# Patient Record
Sex: Female | Born: 1970 | Race: Black or African American | Hispanic: No | Smoking: Never smoker
Health system: Southern US, Community
[De-identification: ages and names within clinical notes are randomized; demographics above are authoritative.]

## PROBLEM LIST (undated history)

## (undated) DIAGNOSIS — H409 Unspecified glaucoma: Secondary | ICD-10-CM

## (undated) DIAGNOSIS — I10 Essential (primary) hypertension: Secondary | ICD-10-CM

## (undated) HISTORY — PX: OTHER SURGICAL HISTORY: SHX169

## (undated) HISTORY — PX: ABDOMINAL HYSTERECTOMY: SHX81

---

## 2004-12-23 ENCOUNTER — Emergency Department: Payer: Self-pay | Admitting: Emergency Medicine

## 2006-07-29 ENCOUNTER — Emergency Department: Payer: Self-pay | Admitting: Emergency Medicine

## 2006-08-24 ENCOUNTER — Emergency Department: Payer: Self-pay | Admitting: Emergency Medicine

## 2006-08-24 ENCOUNTER — Other Ambulatory Visit: Payer: Self-pay

## 2008-12-18 ENCOUNTER — Emergency Department: Payer: Self-pay | Admitting: Unknown Physician Specialty

## 2009-03-03 ENCOUNTER — Emergency Department: Payer: Self-pay | Admitting: Emergency Medicine

## 2009-05-04 ENCOUNTER — Ambulatory Visit: Payer: Self-pay | Admitting: Internal Medicine

## 2009-08-26 ENCOUNTER — Ambulatory Visit: Payer: Self-pay | Admitting: Obstetrics and Gynecology

## 2009-09-01 ENCOUNTER — Ambulatory Visit: Payer: Self-pay | Admitting: Obstetrics and Gynecology

## 2009-09-14 ENCOUNTER — Ambulatory Visit: Payer: Self-pay | Admitting: Internal Medicine

## 2009-09-17 ENCOUNTER — Ambulatory Visit: Payer: Self-pay | Admitting: Otolaryngology

## 2009-10-08 ENCOUNTER — Inpatient Hospital Stay: Payer: Self-pay | Admitting: Specialist

## 2010-05-27 ENCOUNTER — Ambulatory Visit: Payer: Self-pay | Admitting: Internal Medicine

## 2010-06-29 ENCOUNTER — Ambulatory Visit: Payer: Self-pay | Admitting: Internal Medicine

## 2011-07-26 ENCOUNTER — Ambulatory Visit: Payer: Self-pay | Admitting: Internal Medicine

## 2011-08-16 ENCOUNTER — Ambulatory Visit: Payer: Self-pay | Admitting: Specialist

## 2011-08-24 ENCOUNTER — Ambulatory Visit: Payer: Self-pay | Admitting: Specialist

## 2011-08-24 LAB — HEMOGLOBIN: HGB: 11.6 g/dL — ABNORMAL LOW (ref 12.0–16.0)

## 2011-10-11 ENCOUNTER — Ambulatory Visit: Payer: Self-pay | Admitting: Emergency Medicine

## 2011-12-11 IMAGING — CR RIGHT ANKLE - COMPLETE 3+ VIEW
1 series · 5 of 5 positions shown · non-contrast
Comparison: none

REASON FOR EXAM: pain and swelling
COMMENTS:

PROCEDURE:     DXR - DXR ANKLE RIGHT COMPLETE  - October 08, 2009  [DATE]
RESULT:     Comparison: None

[Series 1: view not recorded · 0.17mm/px · 5 of 5 slices shown]
[im 1/5]
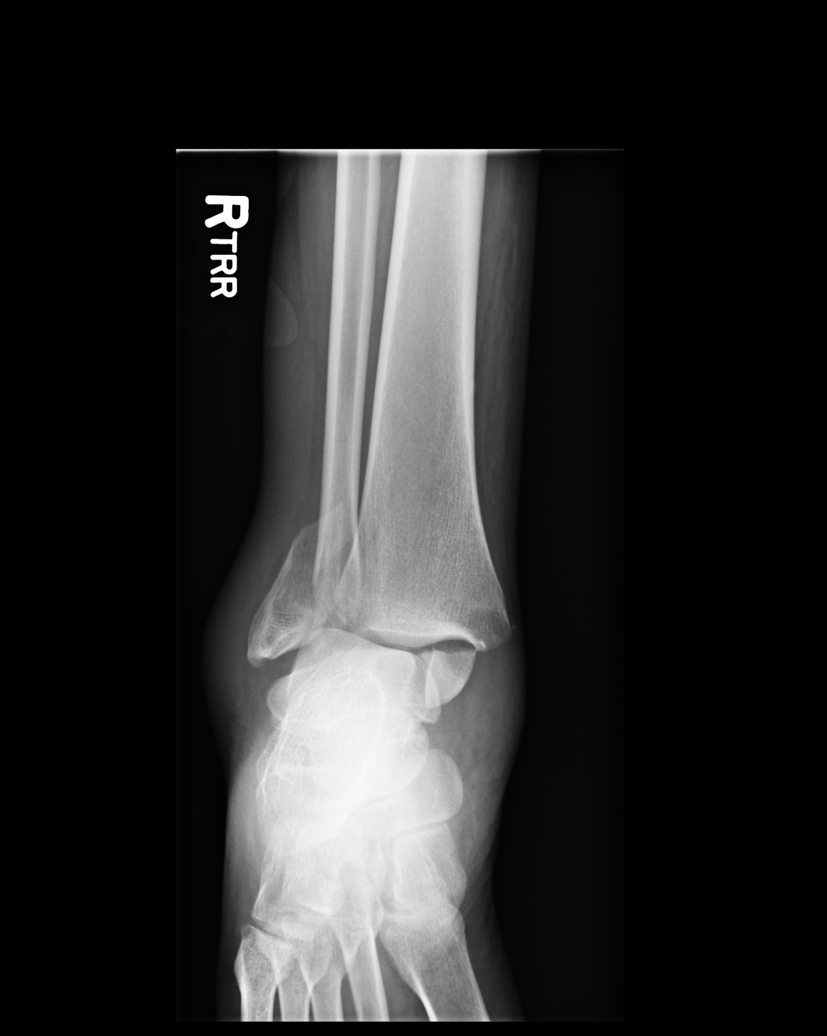
[im 2/5]
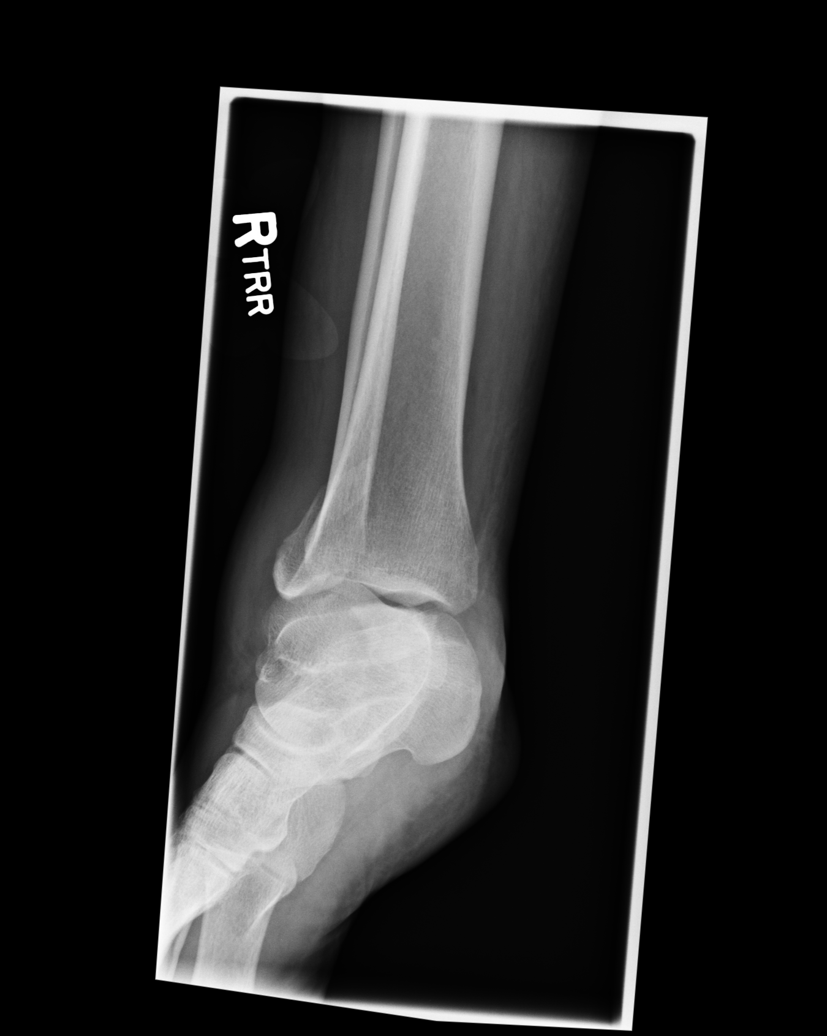
[im 3/5]
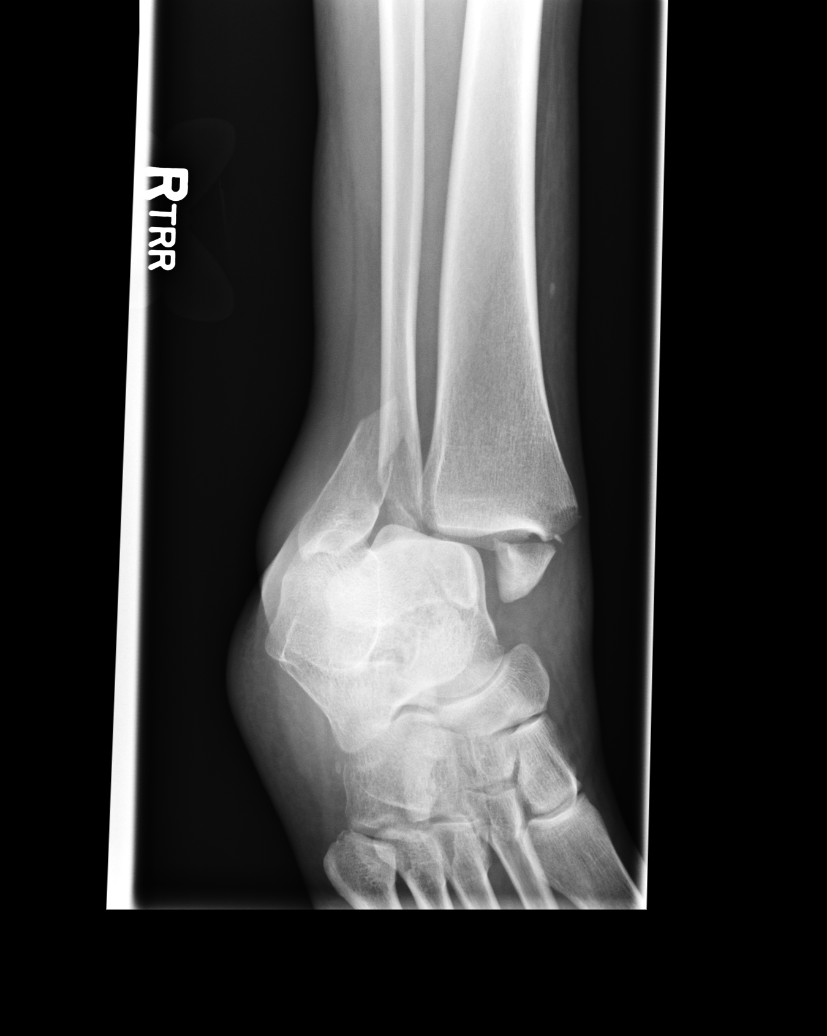
[im 4/5]
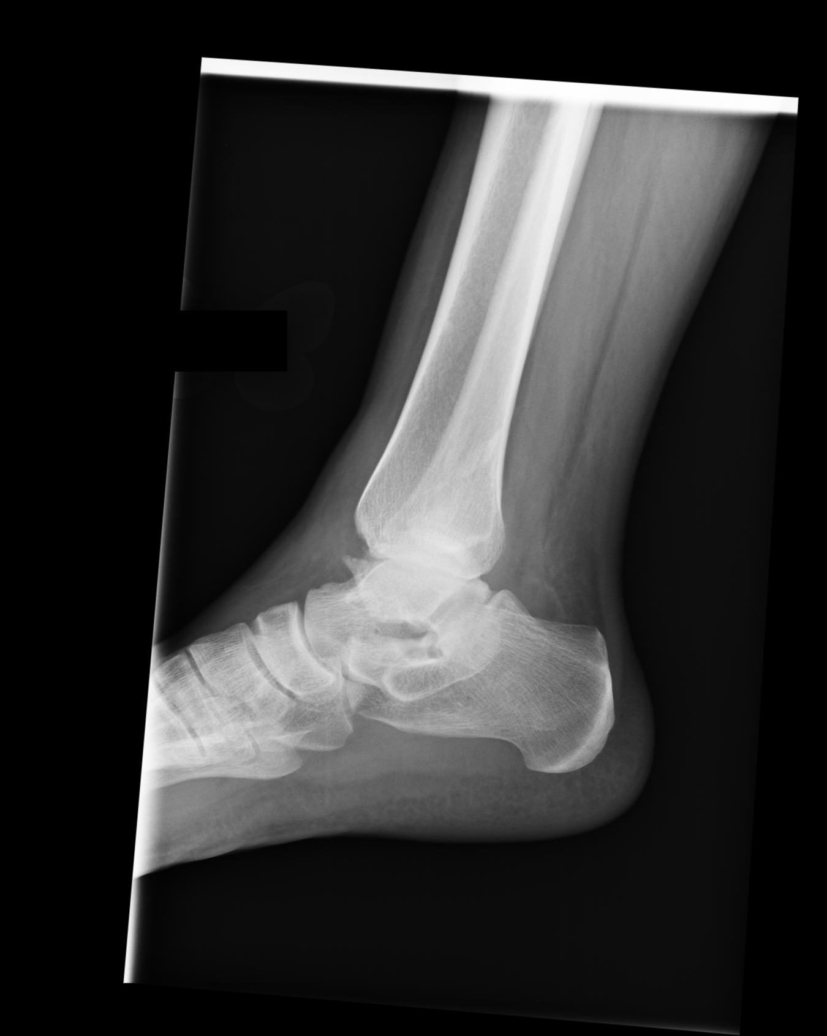
[im 5/5]
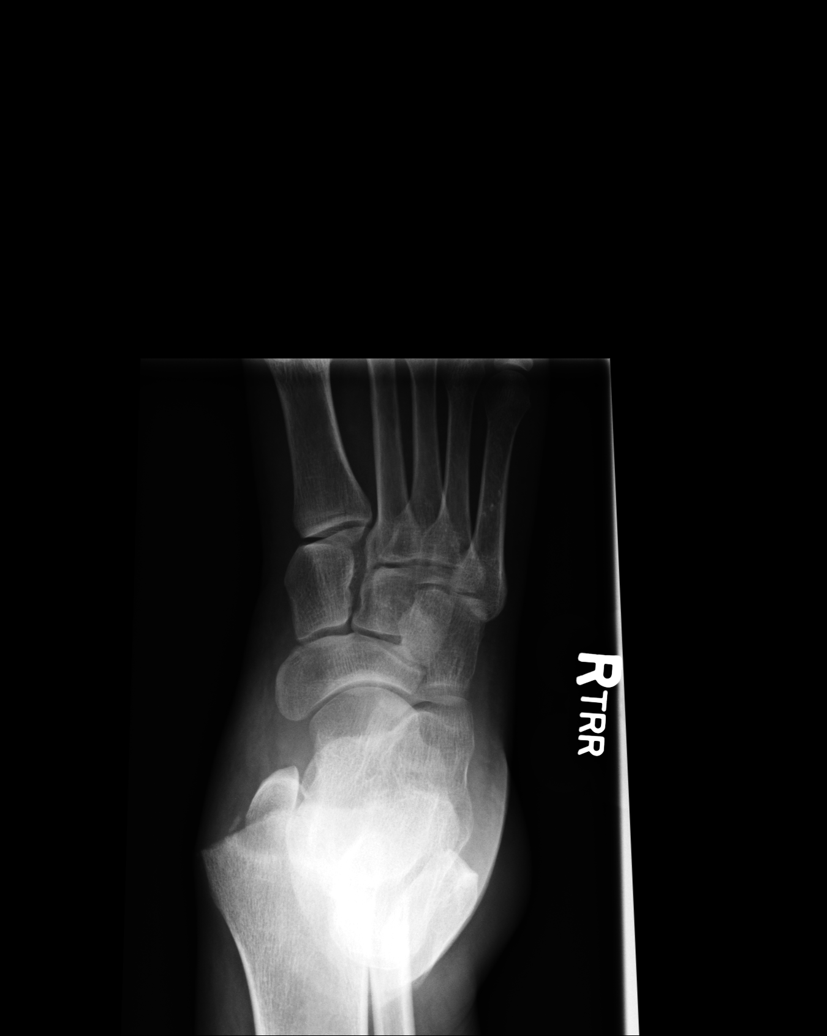

[5 of 5 positions shown; findings below may reference images not displayed]

FINDINGS: 5 views of the right ankle demonstrates an oblique displaced fracture of the
distal fibular diametaphysis. There is a laterally displaced fracture of the
medial malleolus. There is lateral subluxation of the talar dome relative to
the tibial plafond. There is severe soft tissue swelling surrounding the
ankle.
IMPRESSION: Please see above.

## 2012-07-29 IMAGING — US US EXTREM LOW VENOUS*R*
1 series · 17 of 24 positions shown · non-contrast
Comparison: none

REASON FOR EXAM: CR  [id number]  rt ankle pain swelling  hx ankle
fracture eval for DVT
COMMENTS:

[Series 1: us extrem low venous*right* · 17 of 25 slices shown]
[im 1/25]
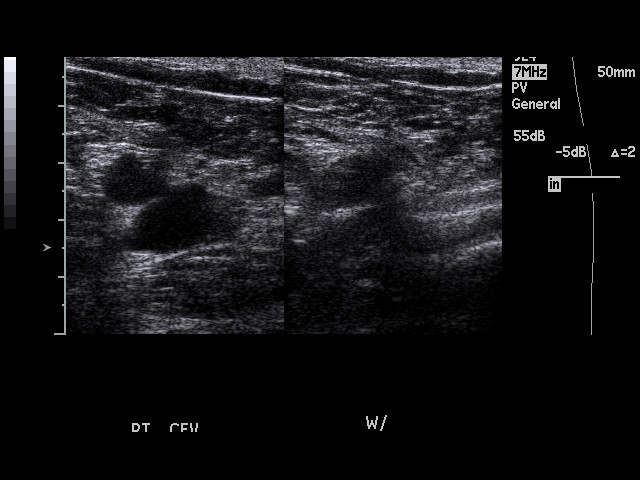
[im 3/25]
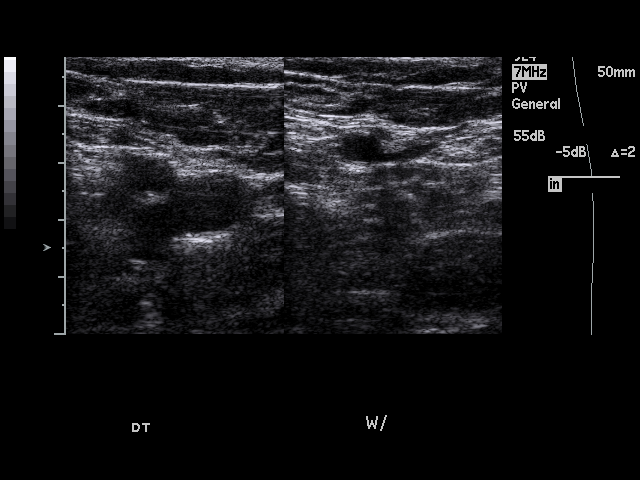
[im 4/25]
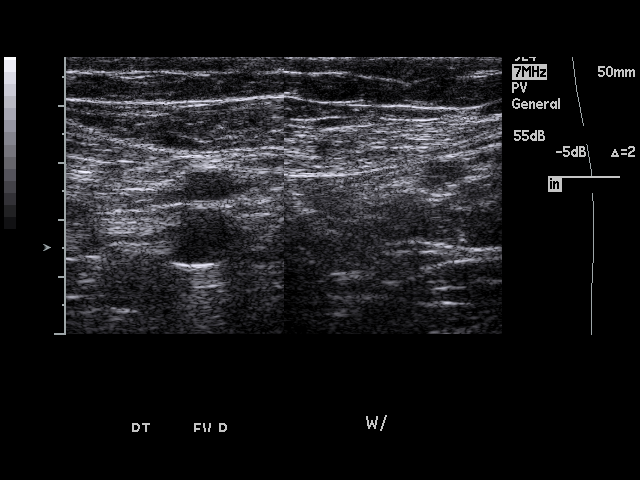
[im 5/25]
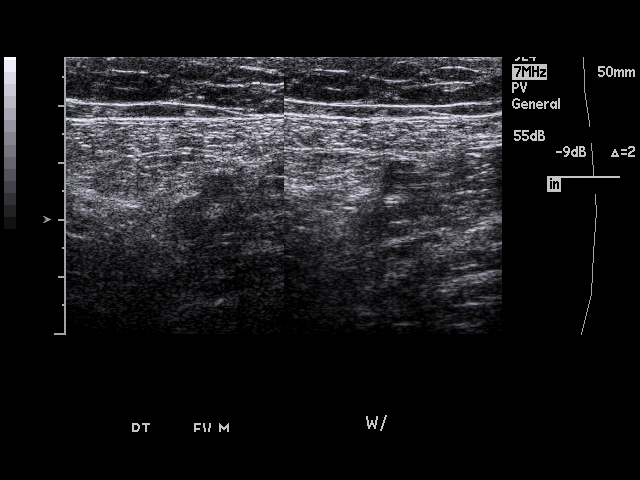
[im 7/25]
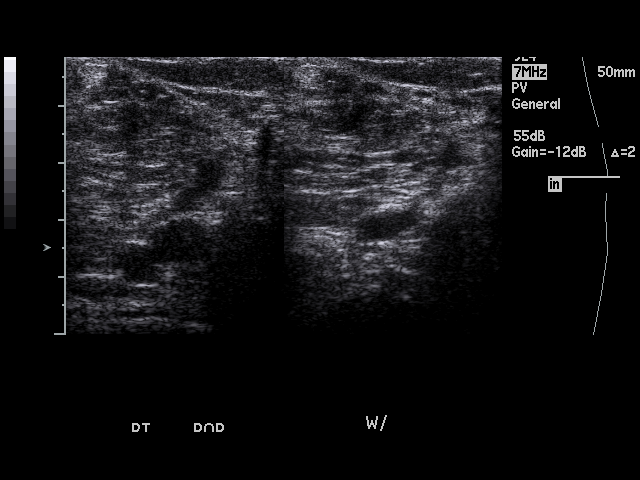
[im 8/25]
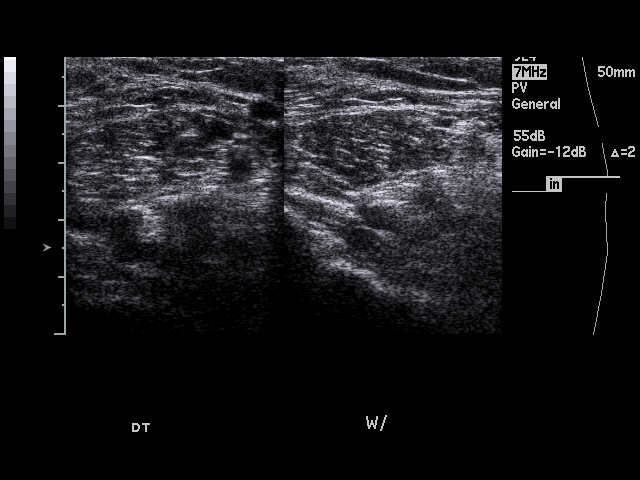
[im 10/25]
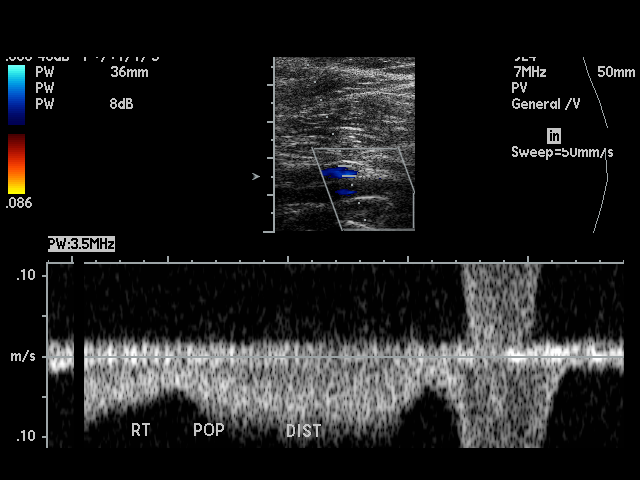
[im 11/25]
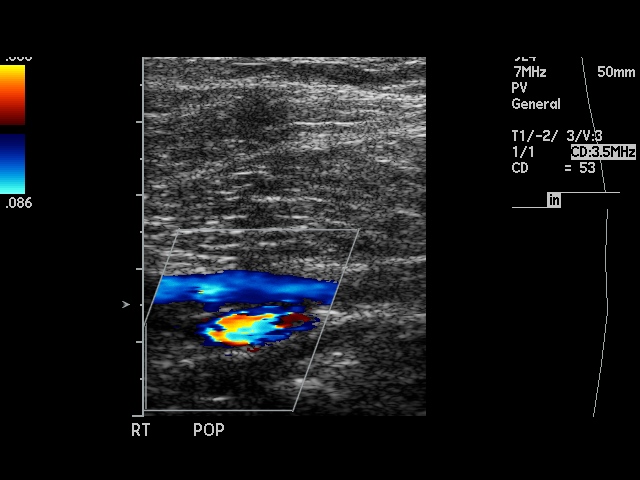
[im 13/25]
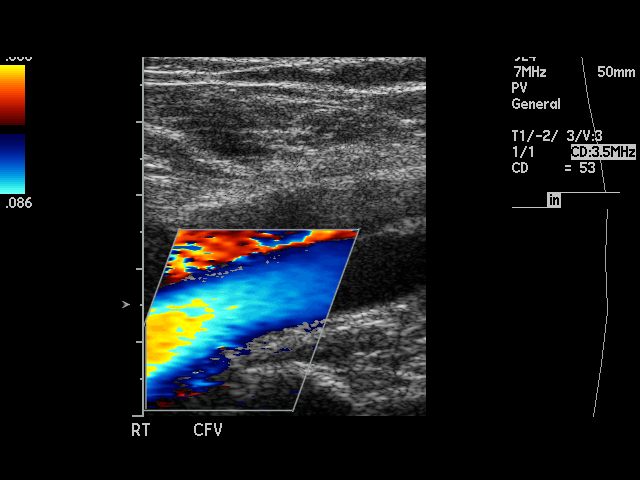
[im 14/25]
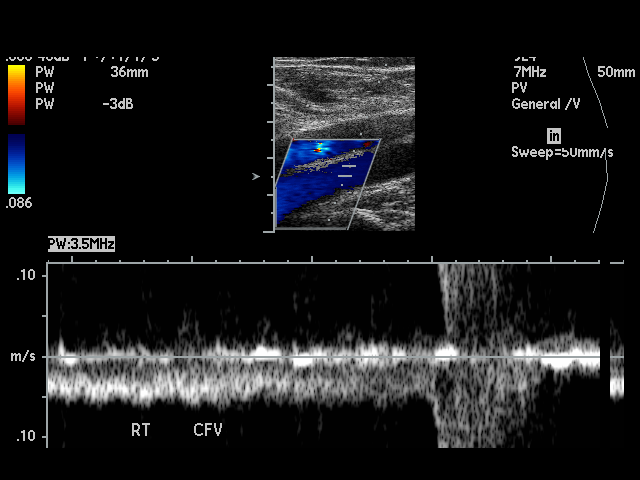
[im 15/25]
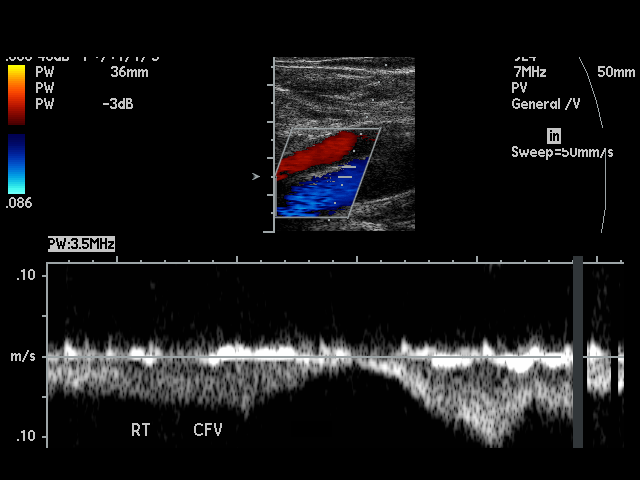
[im 17/25]
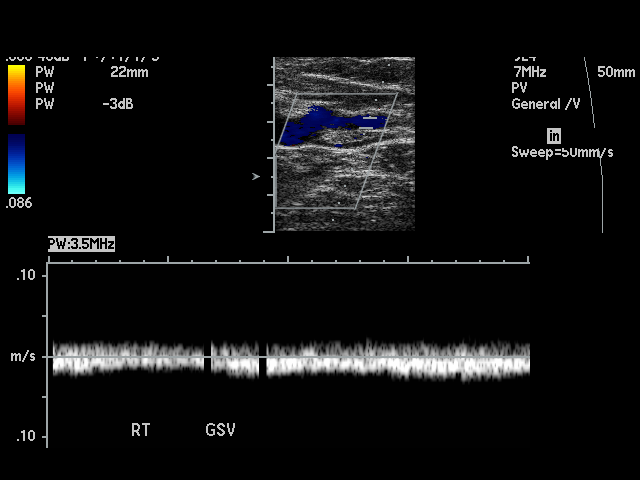
[im 18/25]
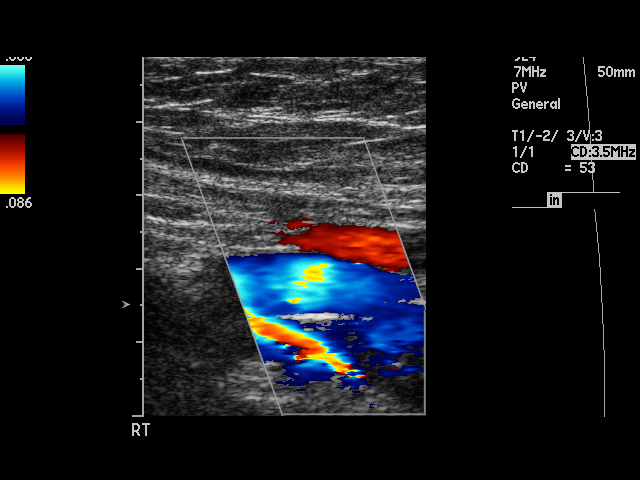
[im 20/25]
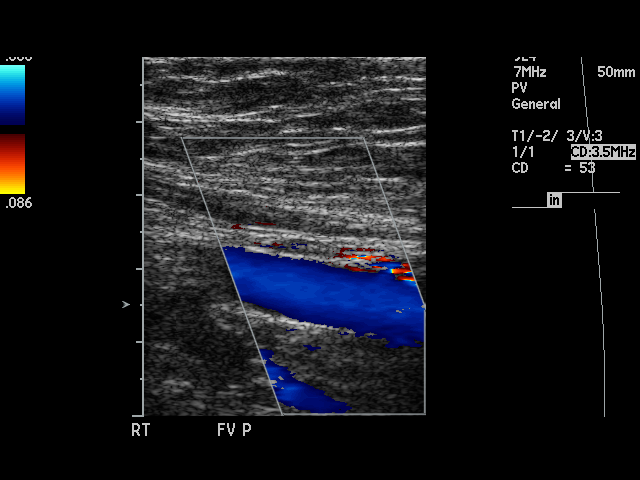
[im 21/25]
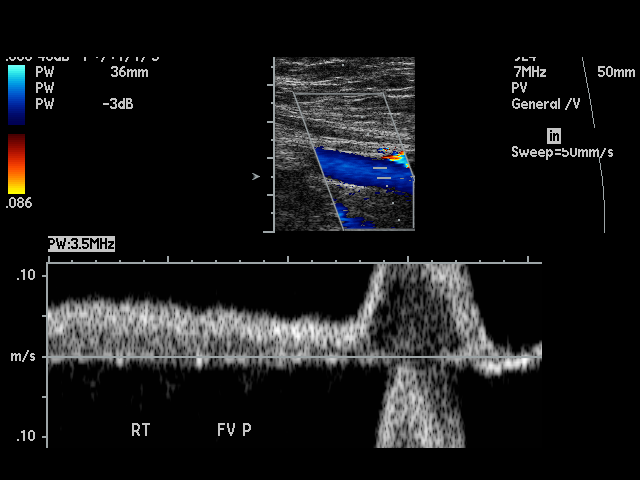
[im 22/25]
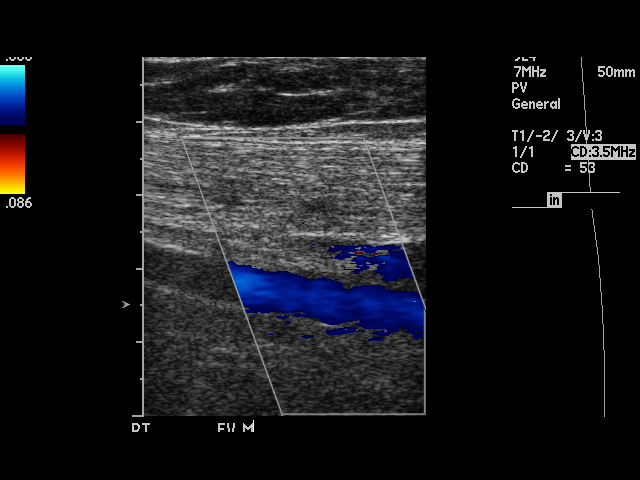
[im 25/25]
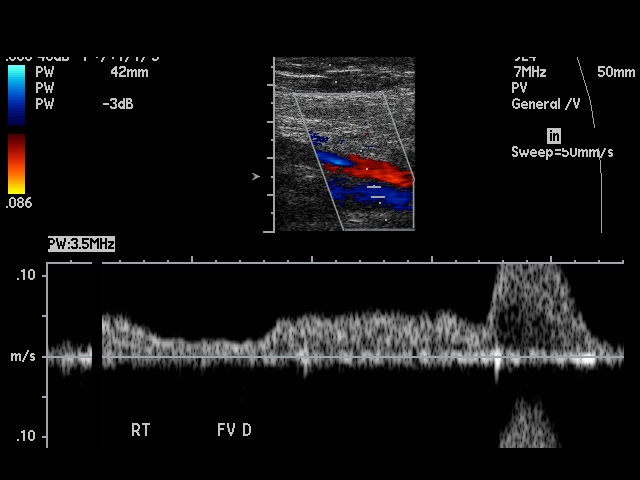

[17 of 24 positions shown; findings below may reference images not displayed]

PROCEDURE:     CAMBEROS - CAMBEROS DOPPLER VEINS RT LEG EXTR  - May 27, 2010  [DATE]

RESULT:     The phasic, augmentation and Valsalva waveforms are normal in
appearance. The right femoral and popliteal vein shows complete
compressibility throughout its course. Doppler examination shows no
occlusion or evidence for deep vein thrombosis.
IMPRESSION: No deep vein thrombosis is identified in the right leg.

## 2012-08-31 ENCOUNTER — Ambulatory Visit: Payer: Self-pay | Admitting: Internal Medicine

## 2013-08-16 ENCOUNTER — Emergency Department: Payer: Self-pay | Admitting: Emergency Medicine

## 2013-09-02 ENCOUNTER — Ambulatory Visit: Payer: Self-pay | Admitting: Internal Medicine

## 2013-11-25 ENCOUNTER — Emergency Department: Payer: Self-pay | Admitting: Emergency Medicine

## 2014-03-10 ENCOUNTER — Ambulatory Visit: Payer: Self-pay | Admitting: General Practice

## 2014-03-26 ENCOUNTER — Ambulatory Visit: Payer: Self-pay | Admitting: Anesthesiology

## 2014-03-26 DIAGNOSIS — I10 Essential (primary) hypertension: Secondary | ICD-10-CM

## 2014-03-26 DIAGNOSIS — Z01810 Encounter for preprocedural cardiovascular examination: Secondary | ICD-10-CM

## 2014-03-31 ENCOUNTER — Ambulatory Visit: Payer: Self-pay | Admitting: General Practice

## 2014-09-22 ENCOUNTER — Ambulatory Visit
Admit: 2014-09-22 | Disposition: A | Discharge status: Home / Self Care | Payer: Self-pay | Attending: Internal Medicine | Admitting: Internal Medicine
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
## 2014-10-05 NOTE — Op Note (Signed)
PATIENT NAME:  Mallory Holland, Mallory Holland KidneyDEBRA MR#:  [id number] DATE OF BIRTH:  May 09, 1971  DATE OF PROCEDURE:  08/24/2011  PREOPERATIVE DIAGNOSIS: Painful hardware with healed fractures of right ankle.   POSTOPERATIVE DIAGNOSIS: Painful hardware with healed fractures of right ankle.  OPERATION: Deep hardware removal, right ankle (10 screws and one locking plate).   SURGEON: Valinda HoarHoward E. Eaven Schwager, MD   ANESTHESIA: General LMA.   COMPLICATIONS: None.   DRAINS: None.   ESTIMATED BLOOD LOSS: None.   DESCRIPTION OF PROCEDURE: The patient was brought to the Operating Room where she underwent satisfactory general LMA anesthesia in the supine position. The right leg was prepped and draped in sterile fashion and an Esmarch was applied. The tourniquet was inflated to 350 mmHg. Tourniquet time was 32 minutes. The wounds were well healed, and there was no soft tissue edema. The lateral incision was reopened sharply and dissection carried out sharply through scar tissue and bursa down to the plate. The plate was exposed and the screws removed along with the plate without difficulty. Bursal tissue over the plate was excised sharply. The rongeur was used to smooth the bone down. The wound was irrigated and closed with 4-0 Vicryl and staples. A short longitudinal incision was made over the medial malleolus using only a portion of the previous incision. Dissection was carried out sharply down to scar tissue, and both screws were identified. These were removed without difficulty. A rongeur was used to remove some of the scar tissue, and the wound was then irrigated. It was closed with 4-0 Vicryl and staples; 0.5% Marcaine was placed in both wounds. A dry sterile compression dressing with posterior splint was applied.     The tourniquet was deflated with good return of blood flow to the foot. The patient was awakened and taken to recovery in good condition.  ____________________________ Valinda HoarHoward E. Lakenzie Mcclafferty, MD hem:cbb D: 08/24/2011  08:41:37 ET T: 08/24/2011 12:52:21 ET JOB#: [id number]  cc: Valinda HoarHoward E. Katheryne Gorr, MD, <Dictator> Valinda HoarHOWARD E Ellery Meroney MD ELECTRONICALLY SIGNED 08/27/2011 0:20

## 2015-10-26 ENCOUNTER — Other Ambulatory Visit: Payer: Self-pay | Admitting: Internal Medicine

## 2015-10-26 DIAGNOSIS — Z1231 Encounter for screening mammogram for malignant neoplasm of breast: Secondary | ICD-10-CM

## 2015-11-10 ENCOUNTER — Ambulatory Visit
Admission: RE | Admit: 2015-11-10 | Discharge: 2015-11-10 | Disposition: A | Discharge status: Home / Self Care | Payer: Medicaid Other | Source: Ambulatory Visit | Attending: Internal Medicine | Admitting: Internal Medicine

## 2015-11-10 DIAGNOSIS — Z1231 Encounter for screening mammogram for malignant neoplasm of breast: Secondary | ICD-10-CM

## 2016-09-29 ENCOUNTER — Other Ambulatory Visit: Payer: Self-pay | Admitting: Internal Medicine

## 2016-10-11 ENCOUNTER — Other Ambulatory Visit: Payer: Self-pay | Admitting: Internal Medicine

## 2016-10-11 DIAGNOSIS — Z1231 Encounter for screening mammogram for malignant neoplasm of breast: Secondary | ICD-10-CM

## 2016-11-10 ENCOUNTER — Ambulatory Visit
Admission: RE | Admit: 2016-11-10 | Discharge: 2016-11-10 | Disposition: A | Discharge status: Home / Self Care | Payer: Medicare Other | Source: Ambulatory Visit | Attending: Internal Medicine | Admitting: Internal Medicine

## 2016-11-10 DIAGNOSIS — Z1231 Encounter for screening mammogram for malignant neoplasm of breast: Secondary | ICD-10-CM | POA: Insufficient documentation

## 2016-11-17 ENCOUNTER — Ambulatory Visit
Admission: RE | Admit: 2016-11-17 | Discharge: 2016-11-17 | Disposition: A | Discharge status: Home / Self Care | Payer: Medicare Other | Source: Ambulatory Visit | Attending: Internal Medicine | Admitting: Internal Medicine

## 2016-11-17 ENCOUNTER — Other Ambulatory Visit: Payer: Self-pay | Admitting: Internal Medicine

## 2016-11-17 DIAGNOSIS — S93402S Sprain of unspecified ligament of left ankle, sequela: Secondary | ICD-10-CM | POA: Insufficient documentation

## 2016-11-17 DIAGNOSIS — X58XXXS Exposure to other specified factors, sequela: Secondary | ICD-10-CM | POA: Insufficient documentation

## 2016-11-17 DIAGNOSIS — T148XXA Other injury of unspecified body region, initial encounter: Secondary | ICD-10-CM

## 2016-11-22 ENCOUNTER — Other Ambulatory Visit: Payer: Self-pay | Admitting: Internal Medicine

## 2016-11-22 DIAGNOSIS — M79661 Pain in right lower leg: Secondary | ICD-10-CM

## 2016-11-24 ENCOUNTER — Ambulatory Visit: Payer: Medicaid Other

## 2016-11-29 ENCOUNTER — Ambulatory Visit
Admission: RE | Admit: 2016-11-29 | Discharge: 2016-11-29 | Disposition: A | Discharge status: Home / Self Care | Payer: Medicare Other | Source: Ambulatory Visit | Attending: Internal Medicine | Admitting: Internal Medicine

## 2016-11-29 DIAGNOSIS — M79661 Pain in right lower leg: Secondary | ICD-10-CM | POA: Insufficient documentation

## 2017-03-19 ENCOUNTER — Emergency Department (HOSPITAL_COMMUNITY): Payer: Medicare Other

## 2017-03-19 ENCOUNTER — Encounter (HOSPITAL_COMMUNITY): Payer: Self-pay | Admitting: *Deleted

## 2017-03-19 ENCOUNTER — Emergency Department (HOSPITAL_COMMUNITY)
Admission: EM | Admit: 2017-03-19 | Discharge: 2017-03-19 | Disposition: A | Discharge status: Home Health | Payer: Medicare Other | Attending: Emergency Medicine | Admitting: Emergency Medicine

## 2017-03-19 DIAGNOSIS — R52 Pain, unspecified: Secondary | ICD-10-CM

## 2017-03-19 DIAGNOSIS — Y999 Unspecified external cause status: Secondary | ICD-10-CM | POA: Diagnosis not present

## 2017-03-19 DIAGNOSIS — M546 Pain in thoracic spine: Secondary | ICD-10-CM | POA: Diagnosis not present

## 2017-03-19 DIAGNOSIS — S20212A Contusion of left front wall of thorax, initial encounter: Secondary | ICD-10-CM | POA: Insufficient documentation

## 2017-03-19 DIAGNOSIS — S0083XA Contusion of other part of head, initial encounter: Secondary | ICD-10-CM | POA: Diagnosis not present

## 2017-03-19 DIAGNOSIS — Y939 Activity, unspecified: Secondary | ICD-10-CM | POA: Diagnosis not present

## 2017-03-19 DIAGNOSIS — S00511A Abrasion of lip, initial encounter: Secondary | ICD-10-CM | POA: Diagnosis not present

## 2017-03-19 DIAGNOSIS — Y92009 Unspecified place in unspecified non-institutional (private) residence as the place of occurrence of the external cause: Secondary | ICD-10-CM | POA: Insufficient documentation

## 2017-03-19 DIAGNOSIS — S0003XA Contusion of scalp, initial encounter: Secondary | ICD-10-CM

## 2017-03-19 DIAGNOSIS — S63501A Unspecified sprain of right wrist, initial encounter: Secondary | ICD-10-CM | POA: Insufficient documentation

## 2017-03-19 DIAGNOSIS — S0080XA Unspecified superficial injury of other part of head, initial encounter: Secondary | ICD-10-CM | POA: Diagnosis present

## 2017-03-19 MED ORDER — CYCLOBENZAPRINE HCL 5 MG PO TABS: 5.0000 mg | ORAL_TABLET | Freq: Three times a day (TID) | ORAL | 0 refills | Status: DC | PRN

## 2017-03-19 MED ORDER — KETOROLAC TROMETHAMINE 60 MG/2ML IM SOLN
60.0000 mg | Freq: Once | INTRAMUSCULAR | Status: AC
  Administered 2017-03-19: 60 mg via INTRAMUSCULAR
  Filled 2017-03-19: qty 2

## 2017-03-19 MED ORDER — CYCLOBENZAPRINE HCL 10 MG PO TABS
10.0000 mg | ORAL_TABLET | Freq: Once | ORAL | Status: AC
  Administered 2017-03-19: 10 mg via ORAL
  Filled 2017-03-19: qty 1

## 2017-03-19 MED ORDER — NAPROXEN 500 MG PO TABS: ORAL_TABLET | ORAL | 0 refills | Status: AC
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
## 2017-03-19 NOTE — Discharge Instructions (Signed)
Use ice to the injured areas for the next several days. Take the medications as prescribed. Wear the wrist splint for the next week, if you are still having pain in your wrist, continue wearing the splint and call Dr Mort Sawyers office to get an appointment to have him recheck your wrist. He is an orthopedist. Return to the ED for any problems listed on the head injury sheet. If your teeth continue to be sore you should be rechecked by dentist. Do not eat any hard food into your teeth are no longer hurting.

## 2017-03-19 NOTE — ED Notes (Signed)
Patient taking to restroom via wheelchair. States that her pain in a 9 out 10. States that her left side hurts, her right wrist, right leg, back and her head.
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
## 2018-01-08 ENCOUNTER — Other Ambulatory Visit: Payer: Self-pay | Admitting: Physician Assistant

## 2018-01-08 DIAGNOSIS — Z1231 Encounter for screening mammogram for malignant neoplasm of breast: Secondary | ICD-10-CM

## 2018-01-15 ENCOUNTER — Telehealth: Payer: Self-pay | Admitting: Gastroenterology

## 2018-01-15 NOTE — Telephone Encounter (Signed)
Gastroenterology Pre-Procedure Review  Request Date: 02/21/18     [facility]: Dr. Maximino Greenlandahiliani  PATIENT REVIEW QUESTIONS: The patient responded to the following health history questions as indicated:    1. Are you having any GI issues? no 2. Do you have a personal history of Polyps? no 3. Do you have a family history of Colon Cancer or Polyps? Yes, Mother CC 4. Diabetes Mellitus? no 5. Joint replacements in the past 12 months?no 6. 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 Any artificial heart valves, MVP, or defibrillator?no    MEDICATIONS & ALLERGIES:    Patient reports the following regarding taking any anticoagulation/antiplatelet therapy:   Plavix, Coumadin, Eliquis, Xarelto, Lovenox, Pradaxa, Brilinta, or Effient? NO Aspirin? no  Patient confirms/reports the following medications:  Current Outpatient Medications  Medication Sig Dispense Refill  . cyclobenzaprine (FLEXERIL) 5 MG tablet Take 1 tablet (5 mg total) by mouth 3 (three) times daily as needed. 30 tablet 0  . naproxen (NAPROSYN) 500 MG tablet Take 1 po BID with food prn pain 30 tablet 0   No current facility-administered medications for this visit.     Patient confirms/reports the following allergies:  No Known Allergies  No orders of the defined types were placed in this encounter.   AUTHORIZATION INFORMATION Primary Insurance: 1D#: Group #:  Secondary Insurance: 1D#: Group #:  SCHEDULE INFORMATION: Date: 02/21/18   Tahiliani Time: Location: [facility]

## 2018-01-16 ENCOUNTER — Other Ambulatory Visit: Payer: Self-pay

## 2018-01-16 DIAGNOSIS — Z800 Family history of malignant neoplasm of digestive organs: Secondary | ICD-10-CM

## 2018-01-16 DIAGNOSIS — Z1211 Encounter for screening for malignant neoplasm of colon: Secondary | ICD-10-CM

## 2018-01-30 ENCOUNTER — Ambulatory Visit
Admission: RE | Admit: 2018-01-30 | Discharge: 2018-01-30 | Disposition: A | Discharge status: Home / Self Care | Payer: Medicare Other | Source: Ambulatory Visit | Attending: Physician Assistant | Admitting: Physician Assistant

## 2018-01-30 DIAGNOSIS — Z1231 Encounter for screening mammogram for malignant neoplasm of breast: Secondary | ICD-10-CM | POA: Diagnosis present

## 2018-02-21 ENCOUNTER — Encounter: Payer: Self-pay | Admitting: *Deleted

## 2018-02-21 ENCOUNTER — Ambulatory Visit: Payer: Medicare Other | Admitting: Anesthesiology

## 2018-02-21 ENCOUNTER — Encounter
Admission: RE | Disposition: A | Discharge status: Home / Self Care | Payer: Self-pay | Source: Ambulatory Visit | Attending: Gastroenterology

## 2018-02-21 ENCOUNTER — Ambulatory Visit
Admission: RE | Admit: 2018-02-21 | Discharge: 2018-02-21 | Disposition: A | Discharge status: Home / Self Care | Payer: Medicare Other | Source: Ambulatory Visit | Attending: Gastroenterology | Admitting: Gastroenterology

## 2018-02-21 DIAGNOSIS — Z79899 Other long term (current) drug therapy: Secondary | ICD-10-CM | POA: Insufficient documentation

## 2018-02-21 DIAGNOSIS — Z800 Family history of malignant neoplasm of digestive organs: Secondary | ICD-10-CM | POA: Diagnosis not present

## 2018-02-21 DIAGNOSIS — I10 Essential (primary) hypertension: Secondary | ICD-10-CM | POA: Insufficient documentation

## 2018-02-21 DIAGNOSIS — Z1211 Encounter for screening for malignant neoplasm of colon: Secondary | ICD-10-CM | POA: Insufficient documentation

## 2018-02-21 HISTORY — PX: COLONOSCOPY WITH PROPOFOL: SHX5780

## 2018-02-21 HISTORY — DX: Unspecified glaucoma: H40.9

## 2018-02-21 HISTORY — DX: Essential (primary) hypertension: I10

## 2018-02-21 SURGERY — COLONOSCOPY WITH PROPOFOL
Anesthesia: General

## 2018-02-21 MED ORDER — PROPOFOL 500 MG/50ML IV EMUL
INTRAVENOUS | Status: DC | PRN
  Administered 2018-02-21: 120 ug/kg/min via INTRAVENOUS

## 2018-02-21 MED ORDER — FENTANYL CITRATE (PF) 100 MCG/2ML IJ SOLN
INTRAMUSCULAR | Status: DC | PRN
  Administered 2018-02-21 (×2): 50 ug via INTRAVENOUS

## 2018-02-21 MED ORDER — PROPOFOL 500 MG/50ML IV EMUL
INTRAVENOUS | Status: AC
  Filled 2018-02-21: qty 50

## 2018-02-21 MED ORDER — FENTANYL CITRATE (PF) 100 MCG/2ML IJ SOLN
INTRAMUSCULAR | Status: AC
  Filled 2018-02-21: qty 2

## 2018-02-21 MED ORDER — SODIUM CHLORIDE 0.9 % IV SOLN
INTRAVENOUS | Status: DC
  Administered 2018-02-21: 1000 mL via INTRAVENOUS

## 2018-02-21 MED ORDER — MIDAZOLAM HCL 2 MG/2ML IJ SOLN
INTRAMUSCULAR | Status: AC
  Filled 2018-02-21: qty 2

## 2018-02-21 MED ORDER — MIDAZOLAM HCL 2 MG/2ML IJ SOLN
INTRAMUSCULAR | Status: DC | PRN
  Administered 2018-02-21: 2 mg via INTRAVENOUS
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
## 2018-02-21 NOTE — Anesthesia Postprocedure Evaluation (Signed)
Anesthesia Post Note  Patient: Mallory Holland  Procedure(s) Performed: COLONOSCOPY WITH PROPOFOL (N/A )  Patient location during evaluation: Endoscopy Anesthesia Type: General Level of consciousness: awake and alert Pain management: pain level controlled Vital Signs Assessment: post-procedure vital signs reviewed and stable Respiratory status: spontaneous breathing, nonlabored ventilation, respiratory function stable and patient connected to nasal cannula oxygen Cardiovascular status: blood pressure returned to baseline and stable Postop Assessment: no apparent nausea or vomiting Anesthetic complications: no     Last Vitals:  Vitals:   02/21/18 1130 02/21/18 1140  BP: (!) 146/84 (!) 131/96  Pulse: 68 70  Resp: 14 17  Temp:    SpO2: 100% 100%    Last Pain:  Vitals:   02/21/18 1114  TempSrc: Tympanic  PainSc: Asleep                 Cleda Mccreedy Akira Adelsberger

## 2018-02-21 NOTE — Op Note (Signed)
[facility] Gastroenterology Patient Name: Mallory Holland Procedure Date: 02/21/2018 10:06 AM MRN: [id number] Account #: [id number] Date of Birth: 1970-07-13 Admit Type: Outpatient Age: 47 Room: [facility] ENDO ROOM 4 Gender: Female Note Status: Finalized Procedure:            Colonoscopy Indications:          Screening in patient at increased risk: Family history                        of 1st-degree relative with colorectal cancer before                        age 6 years Providers:            Angelyn Osterberg B. Maximino Greenland MD, MD Referring MD:         Renne Musca MD, MD (Referring MD) Medicines:            Monitored Anesthesia Care Complications:        No immediate complications. Procedure:            Pre-Anesthesia Assessment:                       - Prior to the procedure, a History and Physical was                        performed, and patient medications, allergies and                        sensitivities were reviewed. The patient's tolerance of                        previous anesthesia was reviewed.                       - The risks and benefits of the procedure and the                        sedation options and risks were discussed with the                        patient. All questions were answered and informed                        consent was obtained.                       - Patient identification and proposed procedure were                        verified prior to the procedure by the physician, the                        nurse, the anesthetist and the technician. The                        procedure was verified in the pre-procedure area in the                        procedure room in the endoscopy suite.                       -  ASA Grade Assessment: II - A patient with mild                        systemic disease.                       - After reviewing the risks and benefits, the patient                        was deemed in satisfactory condition to  undergo the                        procedure.                       After obtaining informed consent, the colonoscope was                        passed under direct vision. Throughout the procedure,                        the patient's blood pressure, pulse, and oxygen                        saturations were monitored continuously. The                        Colonoscope was introduced through the anus and                        advanced to the the cecum, identified by appendiceal                        orifice and ileocecal valve. The colonoscopy was                        performed with ease. The patient tolerated the                        procedure well. The quality of the bowel preparation                        was good. Findings:      The perianal and digital rectal examinations were normal.      The rectum, sigmoid colon, descending colon, transverse colon, ascending       colon and cecum appeared normal.      The retroflexed view of the distal rectum and anal verge was normal and       showed no anal or rectal abnormalities. Impression:           - The rectum, sigmoid colon, descending colon,                        transverse colon, ascending colon and cecum are normal.                       - The distal rectum and anal verge are normal on                        retroflexion view.                       -  No specimens collected. Recommendation:       - Discharge patient to home.                       - Resume previous diet.                       - Continue present medications.                       - Repeat colonoscopy in 5 years for screening purposes.                       - Return to primary care physician as previously                        scheduled.                       - The findings and recommendations were discussed with                        the patient.                       - The findings and recommendations were discussed with                        the  patient's family. Procedure Code(s):    --- Professional ---                       W0981, Colorectal cancer screening; colonoscopy on                        individual at high risk Diagnosis Code(s):    --- Professional ---                       Z80.0, Family history of malignant neoplasm of                        digestive organs CPT copyright 2017 American Medical Association. All rights reserved. The codes documented in this report are preliminary and upon coder review may  be revised to meet current compliance requirements.  Melodie Bouillon, MD Michel Bickers B. Maximino Greenland MD, MD 02/21/2018 11:12:11 AM This report has been signed electronically. Number of Addenda: 0 Note Initiated On: 02/21/2018 10:06 AM Scope Withdrawal Time: 0 hours 20 minutes 51 seconds  Total Procedure Duration: 0 hours 34 minutes 34 seconds       [facility]

## 2018-02-21 NOTE — H&P (Signed)
Mallory Bouillon, MD [address], [place], [place], [address] [address], [address], [place], [place], [address] Phone: [phone]  Fax: [phone]  Primary Care Physician:  Hyman Hopes, MD   Pre-Procedure History & Physical: HPI:  Mallory Holland is a 47 y.o. female is here for a colonoscopy.   Past Medical History:  Diagnosis Date  . Glaucoma   . Hypertension     Past Surgical History:  Procedure Laterality Date  . ABDOMINAL HYSTERECTOMY    . broken ankle      Prior to Admission medications   Medication Sig Start Date End Date Taking? Authorizing Provider  lisinopril (PRINIVIL,ZESTRIL) 10 MG tablet Take 10 mg by mouth daily.   Yes [provider]  cyclobenzaprine (FLEXERIL) 5 MG tablet Take 1 tablet (5 mg total) by mouth 3 (three) times daily as needed. 03/19/17   Devoria Albe, MD  naproxen (NAPROSYN) 500 MG tablet Take 1 po BID with food prn pain 03/19/17   Devoria Albe, MD    Allergies as of 01/17/2018  . (No Known Allergies)    Family History  Problem Relation Age of Onset  . Colon cancer Mother     Social History   Socioeconomic History  . Marital status: Widowed    Spouse name: Not on file  . Number of children: Not on file  . Years of education: Not on file  . Highest education level: Not on file  Occupational History  . Not on file  Social Needs  . Financial resource strain: Not on file  . Food insecurity:    Worry: Not on file    Inability: Not on file  . Transportation needs:    Medical: Not on file    Non-medical: Not on file  Tobacco Use  . Smoking status: Never Smoker  . Smokeless tobacco: Never Used  Substance and Sexual Activity  . Alcohol use: No  . Drug use: Not on file  . Sexual activity: Not on file  Lifestyle  . Physical activity:    Days per week: Not on file    Minutes per session: Not on file  . Stress: Not on file  Relationships  . Social connections:    Talks on phone: Not on file    Gets  together: Not on file    Attends religious service: Not on file    Active member of club or organization: Not on file    Attends meetings of clubs or organizations: Not on file    Relationship status: Not on file  . Intimate partner violence:    Fear of current or ex partner: Not on file    Emotionally abused: Not on file    Physically abused: Not on file    Forced sexual activity: Not on file  Other Topics Concern  . Not on file  Social History Narrative  . Not on file    Review of Systems: See HPI, otherwise negative ROS  Physical Exam: BP 130/87   Pulse 69   Temp (!) 97 F (36.1 C) (Tympanic)   Resp 18   Ht 5\' 4"  (1.626 m)   Wt 77.1 kg   SpO2 100%   BMI 29.18 kg/m  General:   Alert,  pleasant and cooperative in NAD Head:  Normocephalic and atraumatic. Neck:  Supple; no masses or thyromegaly. Lungs:  Clear throughout to auscultation, normal respiratory effort.    Heart:  +S1, +S2, Regular rate and rhythm, No edema. Abdomen:  Soft,  nontender and nondistended. Normal bowel sounds, without guarding, and without rebound.   Neurologic:  Alert and  oriented x4;  grossly normal neurologically.  Impression/Plan: Mallory Holland is here for a colonoscopy to be performed for average risk screening.  Risks, benefits, limitations, and alternatives regarding  colonoscopy have been reviewed with the patient.  Questions have been answered.  All parties agreeable.   Pasty Spillers, MD  02/21/2018, 10:07 AM

## 2018-02-21 NOTE — Transfer of Care (Signed)
Immediate Anesthesia Transfer of Care Note  Patient: Mallory Holland  Procedure(s) Performed: COLONOSCOPY WITH PROPOFOL (N/A )  Patient Location: PACU  Anesthesia Type:General  Level of Consciousness: awake and sedated  Airway & Oxygen Therapy: Patient Spontanous Breathing and Patient connected to nasal cannula oxygen  Post-op Assessment: Report given to RN and Post -op Vital signs reviewed and stable  Post vital signs: Reviewed and stable  Last Vitals:  Vitals Value Taken Time  BP    Temp    Pulse    Resp    SpO2      Last Pain:  Vitals:   02/21/18 0951  TempSrc: Tympanic  PainSc: 0-No pain         Complications: No apparent anesthesia complications

## 2018-02-21 NOTE — Anesthesia Post-op Follow-up Note (Signed)
Anesthesia QCDR form completed.        

## 2019-01-09 ENCOUNTER — Other Ambulatory Visit: Payer: Self-pay | Admitting: Internal Medicine

## 2019-01-09 DIAGNOSIS — Z1231 Encounter for screening mammogram for malignant neoplasm of breast: Secondary | ICD-10-CM

## 2019-05-01 ENCOUNTER — Encounter: Payer: Medicare Other | Admitting: Oncology

## 2019-05-03 ENCOUNTER — Encounter: Payer: Self-pay | Admitting: Oncology

## 2019-05-03 ENCOUNTER — Inpatient Hospital Stay: Payer: Medicare HMO

## 2019-05-03 ENCOUNTER — Other Ambulatory Visit: Payer: Self-pay

## 2019-05-03 ENCOUNTER — Inpatient Hospital Stay: Payer: Medicare HMO | Attending: Oncology | Admitting: Oncology

## 2019-05-03 VITALS — BP 122/78 | HR 96 | Temp 98.0°F | Resp 18 | Wt 178.0 lb

## 2019-05-03 DIAGNOSIS — I10 Essential (primary) hypertension: Secondary | ICD-10-CM | POA: Insufficient documentation

## 2019-05-03 DIAGNOSIS — Z79899 Other long term (current) drug therapy: Secondary | ICD-10-CM | POA: Insufficient documentation

## 2019-05-03 DIAGNOSIS — R7401 Elevation of levels of liver transaminase levels: Secondary | ICD-10-CM | POA: Diagnosis not present

## 2019-05-03 DIAGNOSIS — H409 Unspecified glaucoma: Secondary | ICD-10-CM | POA: Diagnosis not present

## 2019-05-03 DIAGNOSIS — D649 Anemia, unspecified: Secondary | ICD-10-CM

## 2019-05-03 LAB — FERRITIN: Ferritin: 607 ng/mL — ABNORMAL HIGH (ref 11–307)

## 2019-05-03 LAB — CBC WITH DIFFERENTIAL/PLATELET
Abs Immature Granulocytes: 0.01 10*3/uL (ref 0.00–0.07)
Basophils Absolute: 0 10*3/uL (ref 0.0–0.1)
Basophils Relative: 1 %
Eosinophils Absolute: 0.1 10*3/uL (ref 0.0–0.5)
Eosinophils Relative: 1 %
HCT: 32.3 % — ABNORMAL LOW (ref 36.0–46.0)
Hemoglobin: 10.9 g/dL — ABNORMAL LOW (ref 12.0–15.0)
Immature Granulocytes: 0 %
Lymphocytes Relative: 40 %
Lymphs Abs: 1.7 10*3/uL (ref 0.7–4.0)
MCH: 30 pg (ref 26.0–34.0)
MCHC: 33.7 g/dL (ref 30.0–36.0)
MCV: 89 fL (ref 80.0–100.0)
Monocytes Absolute: 0.5 10*3/uL (ref 0.1–1.0)
Monocytes Relative: 12 %
Neutro Abs: 1.9 10*3/uL (ref 1.7–7.7)
Neutrophils Relative %: 46 %
Platelets: 281 10*3/uL (ref 150–400)
RBC: 3.63 MIL/uL — ABNORMAL LOW (ref 3.87–5.11)
RDW: 14.1 % (ref 11.5–15.5)
WBC: 4.2 10*3/uL (ref 4.0–10.5)
nRBC: 0 % (ref 0.0–0.2)

## 2019-05-03 LAB — COMPREHENSIVE METABOLIC PANEL
ALT: 65 U/L — ABNORMAL HIGH (ref 0–44)
AST: 92 U/L — ABNORMAL HIGH (ref 15–41)
Albumin: 4.2 g/dL (ref 3.5–5.0)
Alkaline Phosphatase: 46 U/L (ref 38–126)
Anion gap: 12 (ref 5–15)
BUN: 7 mg/dL (ref 6–20)
CO2: 22 mmol/L (ref 22–32)
Calcium: 9.5 mg/dL (ref 8.9–10.3)
Chloride: 107 mmol/L (ref 98–111)
Creatinine, Ser: 0.6 mg/dL (ref 0.44–1.00)
GFR calc Af Amer: >60 mL/min (ref >60)
GFR calc non Af Amer: >60 mL/min (ref >60)
Glucose, Bld: 94 mg/dL (ref 70–99)
Potassium: 3.7 mmol/L (ref 3.5–5.1)
Sodium: 141 mmol/L (ref 135–145)
Total Bilirubin: 0.5 mg/dL (ref 0.3–1.2)
Total Protein: 7.6 g/dL (ref 6.5–8.1)

## 2019-05-03 LAB — IRON AND TIBC
Iron: 132 ug/dL (ref 28–170)
Saturation Ratios: 42 % — ABNORMAL HIGH (ref 10.4–31.8)
TIBC: 314 ug/dL (ref 250–450)
UIBC: 182 ug/dL

## 2019-05-03 NOTE — Progress Notes (Signed)
Patient here for initial visit. No concerns voiced.  

## 2019-05-03 NOTE — Progress Notes (Signed)
Hematology/Oncology Consult note [facility] [phone] [phone]   Patient Care Team: Ellamae Sia, MD as PCP - General (Internal Medicine)  REFERRING PROVIDER: Ellamae Sia, MD  CHIEF COMPLAINTS/REASON FOR VISIT:  Evaluation of Elevated transaminitis, possible iron overload  HISTORY OF PRESENTING ILLNESS:   Mallory Holland is a  48 y.o.  female with PMH listed below was seen in consultation at the request of  Burns, Ala Dach, MD  for evaluation ofTransaminitis, possible iron overload. Patient was recently seen by primary care provider and had blood work done.  Results not available to me.   Patient reports no symptoms.  Feeling well at baseline. Denies any family history of iron overload, liver cancer. Mother deceased from colon cancer.  One of her sisters deceased from cancer, details unknown. Denies any heavy alcohol consumption. Denies weight loss, fever, chills, fatigue, night sweats.     Review of Systems  Constitutional: Negative for appetite change, chills, fatigue and fever.  HENT:   Negative for hearing loss and voice change.   Eyes: Negative for eye problems.  Respiratory: Negative for chest tightness and cough.   Cardiovascular: Negative for chest pain.  Gastrointestinal: Negative for abdominal distention, abdominal pain and blood in stool.  Endocrine: Negative for hot flashes.  Genitourinary: Negative for difficulty urinating and frequency.   Musculoskeletal: Negative for arthralgias.  Skin: Negative for itching and rash.  Neurological: Negative for extremity weakness.  Hematological: Negative for adenopathy.  Psychiatric/Behavioral: Negative for confusion.    MEDICAL HISTORY:  Past Medical History:  Diagnosis Date  . Glaucoma   . Hypertension     SURGICAL HISTORY: Past Surgical History:  Procedure Laterality Date  . ABDOMINAL HYSTERECTOMY    . broken ankle    . COLONOSCOPY WITH PROPOFOL N/A  02/21/2018   Procedure: COLONOSCOPY WITH PROPOFOL;  Surgeon: Virgel Manifold, MD;  Location: [facility] ENDOSCOPY;  Service: Endoscopy;  Laterality: N/A;    SOCIAL HISTORY: Social History   Socioeconomic History  . Marital status: Widowed    Spouse name: Not on file  . Number of children: Not on file  . Years of education: Not on file  . Highest education level: Not on file  Occupational History  . Not on file  Social Needs  . Financial resource strain: Not on file  . Food insecurity    Worry: Not on file    Inability: Not on file  . Transportation needs    Medical: Not on file    Non-medical: Not on file  Tobacco Use  . Smoking status: Never Smoker  . Smokeless tobacco: Never Used  Substance and Sexual Activity  . Alcohol use: No  . Drug use: Not on file  . Sexual activity: Not on file  Lifestyle  . Physical activity    Days per week: Not on file    Minutes per session: Not on file  . Stress: Not on file  Relationships  . Social Herbalist on phone: Not on file    Gets together: Not on file    Attends religious service: Not on file    Active member of club or organization: Not on file    Attends meetings of clubs or organizations: Not on file    Relationship status: Not on file  . Intimate partner violence    Fear of current or ex partner: Not on file    Emotionally abused: Not on file    Physically abused: Not on  file    Forced sexual activity: Not on file  Other Topics Concern  . Not on file  Social History Narrative  . Not on file    FAMILY HISTORY: Family History  Problem Relation Age of Onset  . Colon cancer Mother   . Cancer Sister     ALLERGIES:  has No Known Allergies.  MEDICATIONS:  Current Outpatient Medications  Medication Sig Dispense Refill  . brimonidine (ALPHAGAN) 0.2 % ophthalmic solution 3 (three) times daily.    . cyclobenzaprine (FLEXERIL) 5 MG tablet Take 1 tablet (5 mg total) by mouth 3 (three) times daily as needed. 30  tablet 0  . dorzolamide (TRUSOPT) 2 % ophthalmic solution 1 drop 3 (three) times daily.    . fluticasone (FLONASE) 50 MCG/ACT nasal spray     . ibuprofen (ADVIL) 800 MG tablet Take by mouth.    Marland Kitchen lisinopril (ZESTRIL) 2.5 MG tablet     . naproxen (NAPROSYN) 500 MG tablet Take 1 po BID with food prn pain 30 tablet 0   No current facility-administered medications for this visit.      PHYSICAL EXAMINATION: ECOG PERFORMANCE STATUS: 0 - Asymptomatic Vitals:   05/03/19 1529  BP: 122/78  Pulse: 96  Resp: 18  Temp: 98 F (36.7 C)   Filed Weights   05/03/19 1529  Weight: 178 lb (80.7 kg)    Physical Exam Constitutional:      General: She is not in acute distress. HENT:     Head: Normocephalic and atraumatic.  Eyes:     General: No scleral icterus.    Pupils: Pupils are equal, round, and reactive to light.  Neck:     Musculoskeletal: Normal range of motion and neck supple.  Cardiovascular:     Rate and Rhythm: Normal rate and regular rhythm.     Heart sounds: Normal heart sounds.  Pulmonary:     Effort: Pulmonary effort is normal. No respiratory distress.     Breath sounds: No wheezing.  Abdominal:     General: Bowel sounds are normal. There is no distension.     Palpations: Abdomen is soft. There is no mass.     Tenderness: There is no abdominal tenderness.  Musculoskeletal: Normal range of motion.        General: No deformity.  Skin:    General: Skin is warm and dry.     Findings: No erythema or rash.  Neurological:     Mental Status: She is alert and oriented to person, place, and time.     Cranial Nerves: No cranial nerve deficit.     Coordination: Coordination normal.  Psychiatric:        Behavior: Behavior normal.        Thought Content: Thought content normal.     LABORATORY DATA:  I have reviewed the data as listed Lab Results  Component Value Date   WBC 4.2 05/03/2019   HGB 10.9 (L) 05/03/2019   HCT 32.3 (L) 05/03/2019   MCV 89.0 05/03/2019   PLT 281  05/03/2019   Recent Labs    05/03/19 1601  NA 141  K 3.7  CL 107  CO2 22  GLUCOSE 94  BUN 7  CREATININE 0.60  CALCIUM 9.5  GFRNONAA >60  GFRAA >60  PROT 7.6  ALBUMIN 4.2  AST 92*  ALT 65*  ALKPHOS 46  BILITOT 0.5   Iron/TIBC/Ferritin/ %Sat    Component Value Date/Time   IRON 132 05/03/2019 1601   TIBC 314 05/03/2019  1601   FERRITIN 607 (H) 05/03/2019 1601   IRONPCTSAT 42 (H) 05/03/2019 1601      RADIOGRAPHIC STUDIES: I have personally reviewed the radiological images as listed and agreed with the findings in the report.  No results found.    ASSESSMENT & PLAN:  1. Transaminitis   2. Iron overload   3. Normocytic anemia    Discussed with patient that I will check CBC, CMP, iron panel Labs reviewed.  Results are available after today's clinic encounter. Patient has transaminitis with elevated AST 92, ALT 65.  Normal bilirubin. Iron panel shows ferritin of 607, saturation 42. Check hemochromatosis screening testing.  Anemia, normocytic.  Hemoglobin 10.9.  Etiology unknown.  Check folate, B12, multiple myeloma panel.  Orders Placed This Encounter  Procedures  . CBC with Differential/Platelet    Standing Status:   Future    Number of Occurrences:   1    Standing Expiration Date:   05/02/2020  . Iron and TIBC    Standing Status:   Future    Number of Occurrences:   1    Standing Expiration Date:   05/02/2020  . Ferritin    Standing Status:   Future    Number of Occurrences:   1    Standing Expiration Date:   05/02/2020  . Comprehensive metabolic panel    Standing Status:   Future    Number of Occurrences:   1    Standing Expiration Date:   05/02/2020  . Hemochromatosis DNA-PCR(c282y,h63d)    Standing Status:   Future    Number of Occurrences:   1    Standing Expiration Date:   05/02/2020    All questions were answered. The patient knows to call the clinic with any problems questions or concerns.   Burns, Harriett P, MD    Return of visit: 3  weeks to discuss results. Thank you for this kind referral and the opportunity to participate in the care of this patient. A copy of today's note is routed to referring provider  Total face to face encounter time for this patient visit was 45 min. >50% of the time was  spent in counseling and coordination of care.    Earlie Server, MD, PhD Hematology Oncology [facility] Pager- [phone] 05/03/2019

## 2019-05-06 ENCOUNTER — Telehealth: Payer: Self-pay

## 2019-05-06 NOTE — Telephone Encounter (Signed)
-----   Message from Earlie Server, MD sent at 05/03/2019 10:10 PM EST ----- Please arrange patient to get additional blood work done.-Ordered.

## 2019-05-06 NOTE — Telephone Encounter (Signed)
Done... Repeat Labs on .Marland KitchenMarland KitchenPt is aware.Marland Kitchen [place]

## 2019-05-07 LAB — HEMOCHROMATOSIS DNA-PCR(C282Y,H63D)

## 2019-05-08 ENCOUNTER — Inpatient Hospital Stay: Payer: Medicare HMO

## 2019-05-13 ENCOUNTER — Inpatient Hospital Stay: Payer: Medicare HMO

## 2019-05-13 ENCOUNTER — Other Ambulatory Visit: Payer: Self-pay

## 2019-05-13 DIAGNOSIS — R7401 Elevation of levels of liver transaminase levels: Secondary | ICD-10-CM

## 2019-05-13 DIAGNOSIS — D649 Anemia, unspecified: Secondary | ICD-10-CM

## 2019-05-13 LAB — FOLATE: Folate: 6.9 ng/mL (ref >5.9)

## 2019-05-13 LAB — VITAMIN B12: Vitamin B-12: 362 pg/mL (ref 180–914)

## 2019-05-14 LAB — MULTIPLE MYELOMA PANEL, SERUM
Albumin SerPl Elph-Mcnc: 4.3 g/dL (ref 2.9–4.4)
Albumin/Glob SerPl: 1.4 (ref 0.7–1.7)
Alpha 1: 0.3 g/dL (ref 0.0–0.4)
Alpha2 Glob SerPl Elph-Mcnc: 0.7 g/dL (ref 0.4–1.0)
B-Globulin SerPl Elph-Mcnc: 1.2 g/dL (ref 0.7–1.3)
Gamma Glob SerPl Elph-Mcnc: 1 g/dL (ref 0.4–1.8)
Globulin, Total: 3.1 g/dL (ref 2.2–3.9)
IgA: 461 mg/dL — ABNORMAL HIGH (ref 87–352)
IgG (Immunoglobin G), Serum: 1093 mg/dL (ref 586–1602)
IgM (Immunoglobulin M), Srm: 54 mg/dL (ref 26–217)
Total Protein ELP: 7.4 g/dL (ref 6.0–8.5)

## 2019-05-14 LAB — KAPPA/LAMBDA LIGHT CHAINS
Kappa free light chain: 19.3 mg/L (ref 3.3–19.4)
Kappa, lambda light chain ratio: 1.02 (ref 0.26–1.65)
Lambda free light chains: 19 mg/L (ref 5.7–26.3)

## 2019-05-24 ENCOUNTER — Encounter: Payer: Self-pay | Admitting: Oncology

## 2019-05-24 ENCOUNTER — Inpatient Hospital Stay: Payer: Medicare HMO | Attending: Oncology | Admitting: Oncology

## 2019-05-24 ENCOUNTER — Other Ambulatory Visit: Payer: Self-pay

## 2019-05-24 VITALS — BP 139/77 | HR 83 | Temp 97.7°F | Resp 18 | Wt 174.2 lb

## 2019-05-24 DIAGNOSIS — I10 Essential (primary) hypertension: Secondary | ICD-10-CM | POA: Insufficient documentation

## 2019-05-24 DIAGNOSIS — R7989 Other specified abnormal findings of blood chemistry: Secondary | ICD-10-CM

## 2019-05-24 DIAGNOSIS — D649 Anemia, unspecified: Secondary | ICD-10-CM | POA: Diagnosis not present

## 2019-05-24 DIAGNOSIS — Z79899 Other long term (current) drug therapy: Secondary | ICD-10-CM | POA: Insufficient documentation

## 2019-05-24 DIAGNOSIS — R7401 Elevation of levels of liver transaminase levels: Secondary | ICD-10-CM | POA: Diagnosis not present

## 2019-05-24 DIAGNOSIS — H409 Unspecified glaucoma: Secondary | ICD-10-CM | POA: Diagnosis not present

## 2019-05-24 NOTE — Progress Notes (Signed)
Patient does not offer any problems today.  

## 2019-05-26 NOTE — Progress Notes (Signed)
Hematology/Oncology follow-up note [facility] [phone] [phone]   Patient Care Team: Ellamae Sia, MD as PCP - General (Internal Medicine)  REFERRING PROVIDER: Ellamae Sia, MD  CHIEF COMPLAINTS/REASON FOR VISIT:  Follow-up elevated transaminitis, possible iron overload  HISTORY OF PRESENTING ILLNESS:   Mallory Holland is a  48 y.o.  female with PMH listed below was seen in consultation at the request of  Burns, Ala Dach, MD  for evaluation ofTransaminitis, possible iron overload. Patient was recently seen by primary care provider and had blood work done.  Results not available to me.   Patient reports no symptoms.  Feeling well at baseline. Denies any family history of iron overload, liver cancer. Mother deceased from colon cancer.  One of her sisters deceased from cancer, details unknown. Denies any heavy alcohol consumption. Denies weight loss, fever, chills, fatigue, night sweats.   INTERVAL HISTORY Mallory Holland is a 48 y.o. female who has above history reviewed by me today presents for follow up visit for management of transaminitis and elevated ferritin Problems and complaints are listed below: Patient had blood work during the interval.  She reports feeling well today.  Denies any new complaints. Review of Systems  Constitutional: Negative for appetite change, chills, fatigue and fever.  HENT:   Negative for hearing loss and voice change.   Eyes: Negative for eye problems.  Respiratory: Negative for chest tightness and cough.   Cardiovascular: Negative for chest pain.  Gastrointestinal: Negative for abdominal distention, abdominal pain and blood in stool.  Endocrine: Negative for hot flashes.  Genitourinary: Negative for difficulty urinating and frequency.   Musculoskeletal: Negative for arthralgias.  Skin: Negative for itching and rash.  Neurological: Negative for extremity weakness.  Hematological: Negative for  adenopathy.  Psychiatric/Behavioral: Negative for confusion.    MEDICAL HISTORY:  Past Medical History:  Diagnosis Date  . Glaucoma   . Hypertension     SURGICAL HISTORY: Past Surgical History:  Procedure Laterality Date  . ABDOMINAL HYSTERECTOMY    . broken ankle    . COLONOSCOPY WITH PROPOFOL N/A 02/21/2018   Procedure: COLONOSCOPY WITH PROPOFOL;  Surgeon: Virgel Manifold, MD;  Location: [facility] ENDOSCOPY;  Service: Endoscopy;  Laterality: N/A;    SOCIAL HISTORY: Social History   Socioeconomic History  . Marital status: Widowed    Spouse name: Not on file  . Number of children: Not on file  . Years of education: Not on file  . Highest education level: Not on file  Occupational History  . Not on file  Tobacco Use  . Smoking status: Never Smoker  . Smokeless tobacco: Never Used  Substance and Sexual Activity  . Alcohol use: No  . Drug use: Not on file  . Sexual activity: Not on file  Other Topics Concern  . Not on file  Social History Narrative  . Not on file   Social Determinants of Health   Financial Resource Strain:   . Difficulty of Paying Living Expenses: Not on file  Food Insecurity:   . Worried About [place] fund[place] in the Last Year: Not on file  . Ran Out of Food in the Last Year: Not on file  Transportation Needs:   . Lack of Transportation (Medical): Not on file  . Lack of Transportation (Non-Medical): Not on file  Physical Activity:   . Days of Exercise per Week: Not on file  . Minutes of Exercise per Session: Not on file  Stress:   .  Feeling of Stress : Not on file  Social Connections:   . Frequency of Communication with Friends and Family: Not on file  . Frequency of Social Gatherings with Friends and Family: Not on file  . Attends Religious Services: Not on file  . Active Member of Clubs or Organizations: Not on file  . Attends Archivist Meetings: Not on file  . Marital Status: Not on file  Intimate Partner Violence:   .  Fear of Current or Ex-Partner: Not on file  . Emotionally Abused: Not on file  . Physically Abused: Not on file  . Sexually Abused: Not on file    FAMILY HISTORY: Family History  Problem Relation Age of Onset  . Colon cancer Mother   . Cancer Sister     ALLERGIES:  has No Known Allergies.  MEDICATIONS:  Current Outpatient Medications  Medication Sig Dispense Refill  . brimonidine (ALPHAGAN) 0.2 % ophthalmic solution 3 (three) times daily.    . cyclobenzaprine (FLEXERIL) 5 MG tablet Take 1 tablet (5 mg total) by mouth 3 (three) times daily as needed. 30 tablet 0  . dorzolamide (TRUSOPT) 2 % ophthalmic solution 1 drop 3 (three) times daily.    . fluticasone (FLONASE) 50 MCG/ACT nasal spray     . ibuprofen (ADVIL) 800 MG tablet Take by mouth.    Marland Kitchen lisinopril (ZESTRIL) 2.5 MG tablet     . naproxen (NAPROSYN) 500 MG tablet Take 1 po BID with food prn pain (Patient not taking: Reported on 05/24/2019) 30 tablet 0   No current facility-administered medications for this visit.     PHYSICAL EXAMINATION: ECOG PERFORMANCE STATUS: 0 - Asymptomatic Vitals:   05/24/19 1435  BP: 139/77  Pulse: 83  Resp: 18  Temp: 97.7 F (36.5 C)   Filed Weights   05/24/19 1435  Weight: 174 lb 3.2 oz (79 kg)    Physical Exam Constitutional:      General: She is not in acute distress. HENT:     Head: Normocephalic and atraumatic.  Eyes:     General: No scleral icterus.    Pupils: Pupils are equal, round, and reactive to light.  Cardiovascular:     Rate and Rhythm: Normal rate and regular rhythm.     Heart sounds: Normal heart sounds.  Pulmonary:     Effort: Pulmonary effort is normal. No respiratory distress.     Breath sounds: No wheezing.  Abdominal:     General: Bowel sounds are normal. There is no distension.     Palpations: Abdomen is soft. There is no mass.     Tenderness: There is no abdominal tenderness.  Musculoskeletal:        General: No deformity. Normal range of motion.      Cervical back: Normal range of motion and neck supple.  Skin:    General: Skin is warm and dry.     Findings: No erythema or rash.  Neurological:     Mental Status: She is alert and oriented to person, place, and time.     Cranial Nerves: No cranial nerve deficit.     Coordination: Coordination normal.  Psychiatric:        Behavior: Behavior normal.        Thought Content: Thought content normal.     LABORATORY DATA:  I have reviewed the data as listed Lab Results  Component Value Date   WBC 4.2 05/03/2019   HGB 10.9 (L) 05/03/2019   HCT 32.3 (L) 05/03/2019  MCV 89.0 05/03/2019   PLT 281 05/03/2019   Recent Labs    05/03/19 1601  NA 141  K 3.7  CL 107  CO2 22  GLUCOSE 94  BUN 7  CREATININE 0.60  CALCIUM 9.5  GFRNONAA >60  GFRAA >60  PROT 7.6  ALBUMIN 4.2  AST 92*  ALT 65*  ALKPHOS 46  BILITOT 0.5   Iron/TIBC/Ferritin/ %Sat    Component Value Date/Time   IRON 132 05/03/2019 1601   TIBC 314 05/03/2019 1601   FERRITIN 607 (H) 05/03/2019 1601   IRONPCTSAT 42 (H) 05/03/2019 1601      RADIOGRAPHIC STUDIES: I have personally reviewed the radiological images as listed and agreed with the findings in the report.  No results found.    ASSESSMENT & PLAN:  1. Transaminitis   2. Normocytic anemia   3. Elevated ferritin   Labs reviewed and discussed with patient. Patient has transaminitis with elevated AST 92, ALT 65.  Normal bilirubin. Iron panel shows ferritin of 607, saturation 42. Negative hemochromatosis screening testing. Advised patient to avoid any iron supplementation.  Patient endorses a history of alcohol.  She does not drink currently. Check ultrasound abdomen.  Refer to Dr. Dan Europe.  Anemia, normocytic.  Hemoglobin 10.9.  Consistent with anemia secondary to chronic disease. Normal folate, vitamin B12, adequate iron. Multiple myeloma panel negative.  Orders Placed This Encounter  Procedures  . US Abdomen Complete    Standing Status:    Future    Standing Expiration Date:   05/23/2020    Order Specific Question:   Reason for Exam (SYMPTOM  OR DIAGNOSIS REQUIRED)    Answer:   transaminitis, history of alcohol abuse    Order Specific Question:   Preferred imaging location?    Answer:   [facility]  . Ambulatory referral to Gastroenterology    Referral Priority:   Routine    Referral Type:   Consultation    Referral Reason:   Specialty Services Required    Referred to Provider:   Virgel Manifold, MD    Number of Visits Requested:   1    All questions were answered. The patient knows to call the clinic with any problems questions or concerns.   Ellamae Sia, MD    Return of visit: 6 months Earlie Server, MD, PhD Hematology Oncology [facility] Pager- [phone] 05/26/2019

## 2019-05-27 ENCOUNTER — Encounter: Payer: Self-pay | Admitting: *Deleted

## 2019-06-05 ENCOUNTER — Ambulatory Visit
Admission: RE | Admit: 2019-06-05 | Discharge: 2019-06-05 | Disposition: A | Discharge status: Home / Self Care | Payer: Medicare HMO | Source: Ambulatory Visit | Attending: Oncology | Admitting: Oncology

## 2019-06-05 ENCOUNTER — Other Ambulatory Visit: Payer: Self-pay

## 2019-06-05 DIAGNOSIS — R7401 Elevation of levels of liver transaminase levels: Secondary | ICD-10-CM | POA: Insufficient documentation

## 2019-06-06 NOTE — Progress Notes (Signed)
Please let patient know that her US abdomen showed possible fatty liver disease. Encourage patient to further discuss with GI. Thanks.

## 2019-07-30 ENCOUNTER — Encounter: Payer: Self-pay | Admitting: Gastroenterology

## 2019-07-30 ENCOUNTER — Ambulatory Visit: Payer: Medicare HMO | Admitting: Gastroenterology

## 2019-07-30 ENCOUNTER — Other Ambulatory Visit: Payer: Self-pay

## 2019-07-30 ENCOUNTER — Ambulatory Visit (INDEPENDENT_AMBULATORY_CARE_PROVIDER_SITE_OTHER): Payer: Medicare HMO | Admitting: Gastroenterology

## 2019-07-30 VITALS — BP 110/69 | HR 99 | Temp 98.2°F | Ht 67.0 in | Wt 165.0 lb

## 2019-07-30 DIAGNOSIS — R748 Abnormal levels of other serum enzymes: Secondary | ICD-10-CM

## 2019-07-30 NOTE — Progress Notes (Signed)
Gastroenterology Consultation  Referring Provider:     Rickard Patience, MD Primary Care Physician:  Hyman Hopes, MD Primary Gastroenterologist:  Dr. Servando Snare     Reason for Consultation:     Abnormal liver enzymes        HPI:   Mallory Holland is a 49 y.o. y/o female referred for consultation & management of abnormal liver enzymes by Dr. Lawerance Bach, Shellia Cleverly, MD.  This patient comes in today with a history of abnormal liver enzymes.  The patient denies ever being told that she had abnormal liver enzymes in the past.  She denies any personal history of alcohol abuse or drug abuse.  She denies ever using intravenous drugs.  The patient's most recent lab work in November showed her to have liver enzymes of:  Component     Latest Ref Rng & Units 05/03/2019  AST     15 - 41 U/L 92 (H)  ALT     0 - 44 U/L 65 (H)  Alkaline Phosphatase     38 - 126 U/L 46  Total Bilirubin     0.3 - 1.2 mg/dL 0.5   In December she also had an ultrasound that showed her to have fatty liver.  The patient was seen by hematology for iron overload and had a hemochromatosis gene sent off that did not show any mutation.    Past Medical History:  Diagnosis Date  . Glaucoma   . Hypertension     Past Surgical History:  Procedure Laterality Date  . ABDOMINAL HYSTERECTOMY    . broken ankle    . COLONOSCOPY WITH PROPOFOL N/A 02/21/2018   Procedure: COLONOSCOPY WITH PROPOFOL;  Surgeon: Pasty Spillers, MD;  Location: [facility] ENDOSCOPY;  Service: Endoscopy;  Laterality: N/A;    Prior to Admission medications   Medication Sig Start Date End Date Taking? Authorizing Provider  brimonidine (ALPHAGAN) 0.2 % ophthalmic solution 3 (three) times daily.   Yes [provider]  dorzolamide (TRUSOPT) 2 % ophthalmic solution 1 drop 3 (three) times daily.   Yes [provider]  fluticasone Aleda Grana) 50 MCG/ACT nasal spray  10/05/13  Yes [provider]  ibuprofen (ADVIL) 800 MG tablet Take by mouth. 11/27/14   Yes [provider]  lisinopril (ZESTRIL) 2.5 MG tablet  04/03/19  Yes [provider]  naproxen (NAPROSYN) 500 MG tablet Take 1 po BID with food prn pain Patient not taking: Reported on 05/24/2019 03/19/17   Devoria Albe, MD    Family History  Problem Relation Age of Onset  . Colon cancer Mother   . Cancer Sister      Social History   Tobacco Use  . Smoking status: Never Smoker  . Smokeless tobacco: Never Used  Substance Use Topics  . Alcohol use: No  . Drug use: Not on file    Allergies as of 07/30/2019  . (No Known Allergies)    Review of Systems:    All systems reviewed and negative except where noted in HPI.   Physical Exam:  BP 110/69   Pulse 99   Temp 98.2 F (36.8 C) (Oral)   Ht 5\' 7"  (1.702 m)   Wt 165 lb (74.8 kg)   BMI 25.84 kg/m  No LMP recorded. Patient has had a hysterectomy. General:   Alert,  Well-developed, well-nourished, pleasant and cooperative in NAD Head:  Normocephalic and atraumatic. Eyes:  Sclera clear, no icterus.   Conjunctiva pink. Ears:  Normal auditory acuity. Neck:  Supple; no masses or thyromegaly. Lungs:  Respirations even and unlabored.  Clear throughout to auscultation.   No wheezes, crackles, or rhonchi. No acute distress. Heart:  Regular rate and rhythm; no murmurs, clicks, rubs, or gallops. Abdomen:  Normal bowel sounds.  No bruits.  Soft, non-tender and non-distended without masses, hepatosplenomegaly or hernias noted.  No guarding or rebound tenderness.  Negative Carnett sign.   Rectal:  Deferred.  Pulses:  Normal pulses noted. Extremities:  No clubbing or edema.  No cyanosis. Neurologic:  Alert and oriented x3;  grossly normal neurologically. Skin:  Intact without significant lesions or rashes.  No jaundice. Lymph Nodes:  No significant cervical adenopathy. Psych:  Alert and cooperative. Normal mood and affect.  Imaging Studies: No results found.  Assessment and Plan:   Mallory Holland is a 49 y.o. y/o  female who presents today with abnormal liver enzymes.  The patient will have her lab sent off for other possible causes of abnormal liver enzymes.  It is likely that the patient has her abnormal liver enzymes due to her fatty liver disease.  The patient has been told about managing risk factors for fatty liver disease including weight loss with a low-fat diet and a Mediterranean style diet.  The patient has been told that she will be notified with the results of her labs.  She will also be recommended vaccinations as needed.  The patient has been explained the plan and agrees with it.    Lucilla Lame, MD. Marval Regal    Note: This dictation was prepared with Dragon dictation along with smaller phrase technology. Any transcriptional errors that result from this process are unintentional.

## 2019-07-31 LAB — CERULOPLASMIN: Ceruloplasmin: 19.6 mg/dL (ref 19.0–39.0)

## 2019-07-31 LAB — HEPATIC FUNCTION PANEL
ALT: 91 IU/L — ABNORMAL HIGH (ref 0–32)
AST: 167 IU/L — ABNORMAL HIGH (ref 0–40)
Albumin: 4.7 g/dL (ref 3.8–4.8)
Alkaline Phosphatase: 64 IU/L (ref 39–117)
Bilirubin Total: 0.3 mg/dL (ref 0.0–1.2)
Bilirubin, Direct: 0.15 mg/dL (ref 0.00–0.40)
Total Protein: 7.2 g/dL (ref 6.0–8.5)

## 2019-07-31 LAB — HEPATITIS C ANTIBODY: Hep C Virus Ab: <0.1 s/co ratio (ref 0.0–0.9)

## 2019-07-31 LAB — HEPATITIS B SURFACE ANTIGEN: Hepatitis B Surface Ag: NEGATIVE

## 2019-07-31 LAB — ALPHA-1-ANTITRYPSIN: A-1 Antitrypsin: 128 mg/dL (ref 101–187)

## 2019-07-31 LAB — HEPATITIS A ANTIBODY, TOTAL: hep A Total Ab: POSITIVE — AB

## 2019-07-31 LAB — ANA: Anti Nuclear Antibody (ANA): NEGATIVE

## 2019-07-31 LAB — ANTI-SMOOTH MUSCLE ANTIBODY, IGG: Smooth Muscle Ab: 13 Units (ref 0–19)

## 2019-07-31 LAB — MITOCHONDRIAL ANTIBODIES: Mitochondrial Ab: <20 Units (ref 0.0–20.0)

## 2019-07-31 LAB — HEPATITIS B SURFACE ANTIBODY,QUALITATIVE: Hep B Surface Ab, Qual: NONREACTIVE

## 2019-08-06 ENCOUNTER — Telehealth: Payer: Self-pay

## 2019-08-06 NOTE — Telephone Encounter (Signed)
-----   Message from Midge Minium, MD sent at 07/31/2019 10:04 PM EST ----- Let the patient know that she is immune to hepatitis A but needs a vaccination for hepatitis B.  All of the other labs were negative for any other cause of abnormal liver enzymes.  The patient should try and lose approximately 15 to 20 pounds and have her liver enzymes checked again.  This should be done in 3 months and then if still elevated she may need a liver biopsy.

## 2019-08-06 NOTE — Telephone Encounter (Signed)
Pt notified of lab results

## 2019-08-06 NOTE — Telephone Encounter (Signed)
-----   Message from Darren Wohl, MD sent at 07/31/2019 10:04 PM EST ----- Let the patient know that she is immune to hepatitis A but needs a vaccination for hepatitis B.  All of the other labs were negative for any other cause of abnormal liver enzymes.  The patient should try and lose approximately 15 to 20 pounds and have her liver enzymes checked again.  This should be done in 3 months and then if still elevated she may need a liver biopsy. 

## 2019-10-29 ENCOUNTER — Other Ambulatory Visit: Payer: Self-pay

## 2019-10-29 DIAGNOSIS — R748 Abnormal levels of other serum enzymes: Secondary | ICD-10-CM

## 2019-11-19 ENCOUNTER — Inpatient Hospital Stay: Payer: Medicare HMO

## 2019-11-19 ENCOUNTER — Inpatient Hospital Stay: Payer: Medicare HMO | Admitting: Oncology

## 2019-12-05 ENCOUNTER — Inpatient Hospital Stay: Payer: Medicare HMO | Admitting: Oncology

## 2019-12-05 ENCOUNTER — Inpatient Hospital Stay: Payer: Medicare HMO

## 2019-12-11 ENCOUNTER — Ambulatory Visit
Admission: RE | Admit: 2019-12-11 | Discharge: 2019-12-11 | Disposition: A | Discharge status: Home / Self Care | Payer: Medicare HMO | Source: Ambulatory Visit | Attending: Internal Medicine | Admitting: Internal Medicine

## 2019-12-11 DIAGNOSIS — Z1231 Encounter for screening mammogram for malignant neoplasm of breast: Secondary | ICD-10-CM | POA: Diagnosis not present

## 2019-12-19 ENCOUNTER — Other Ambulatory Visit: Payer: Self-pay | Admitting: Oncology

## 2019-12-19 ENCOUNTER — Inpatient Hospital Stay: Payer: Medicare HMO

## 2019-12-19 ENCOUNTER — Inpatient Hospital Stay: Payer: Medicare HMO | Admitting: Oncology

## 2019-12-19 DIAGNOSIS — R7401 Elevation of levels of liver transaminase levels: Secondary | ICD-10-CM

## 2020-01-06 ENCOUNTER — Inpatient Hospital Stay (HOSPITAL_BASED_OUTPATIENT_CLINIC_OR_DEPARTMENT_OTHER): Payer: Medicare HMO | Admitting: Oncology

## 2020-01-06 ENCOUNTER — Encounter: Payer: Self-pay | Admitting: Oncology

## 2020-01-06 ENCOUNTER — Other Ambulatory Visit: Payer: Self-pay

## 2020-01-06 ENCOUNTER — Inpatient Hospital Stay: Payer: Medicare HMO | Attending: Oncology

## 2020-01-06 VITALS — BP 122/78 | HR 74 | Temp 96.9°F | Resp 18 | Wt 153.7 lb

## 2020-01-06 DIAGNOSIS — I10 Essential (primary) hypertension: Secondary | ICD-10-CM | POA: Insufficient documentation

## 2020-01-06 DIAGNOSIS — R7401 Elevation of levels of liver transaminase levels: Secondary | ICD-10-CM | POA: Insufficient documentation

## 2020-01-06 DIAGNOSIS — Z79899 Other long term (current) drug therapy: Secondary | ICD-10-CM | POA: Insufficient documentation

## 2020-01-06 DIAGNOSIS — R7989 Other specified abnormal findings of blood chemistry: Secondary | ICD-10-CM | POA: Diagnosis not present

## 2020-01-06 DIAGNOSIS — D649 Anemia, unspecified: Secondary | ICD-10-CM | POA: Insufficient documentation

## 2020-01-06 DIAGNOSIS — Z800 Family history of malignant neoplasm of digestive organs: Secondary | ICD-10-CM | POA: Diagnosis not present

## 2020-01-06 DIAGNOSIS — Z90710 Acquired absence of both cervix and uterus: Secondary | ICD-10-CM | POA: Insufficient documentation

## 2020-01-06 LAB — IRON AND TIBC
Iron: 164 ug/dL (ref 28–170)
Saturation Ratios: 60 % — ABNORMAL HIGH (ref 10.4–31.8)
TIBC: 276 ug/dL (ref 250–450)
UIBC: 112 ug/dL

## 2020-01-06 LAB — CBC WITH DIFFERENTIAL/PLATELET
Abs Immature Granulocytes: 0 10*3/uL (ref 0.00–0.07)
Basophils Absolute: 0 10*3/uL (ref 0.0–0.1)
Basophils Relative: 1 %
Eosinophils Absolute: 0 10*3/uL (ref 0.0–0.5)
Eosinophils Relative: 1 %
HCT: 31 % — ABNORMAL LOW (ref 36.0–46.0)
Hemoglobin: 10.4 g/dL — ABNORMAL LOW (ref 12.0–15.0)
Immature Granulocytes: 0 %
Lymphocytes Relative: 49 %
Lymphs Abs: 1.8 10*3/uL (ref 0.7–4.0)
MCH: 30.9 pg (ref 26.0–34.0)
MCHC: 33.5 g/dL (ref 30.0–36.0)
MCV: 92 fL (ref 80.0–100.0)
Monocytes Absolute: 0.6 10*3/uL (ref 0.1–1.0)
Monocytes Relative: 15 %
Neutro Abs: 1.2 10*3/uL — ABNORMAL LOW (ref 1.7–7.7)
Neutrophils Relative %: 34 %
Platelets: 249 10*3/uL (ref 150–400)
RBC: 3.37 MIL/uL — ABNORMAL LOW (ref 3.87–5.11)
RDW: 13.4 % (ref 11.5–15.5)
WBC: 3.7 10*3/uL — ABNORMAL LOW (ref 4.0–10.5)
nRBC: 0 % (ref 0.0–0.2)

## 2020-01-06 LAB — HEPATIC FUNCTION PANEL
ALT: 96 U/L — ABNORMAL HIGH (ref 0–44)
AST: 249 U/L — ABNORMAL HIGH (ref 15–41)
Albumin: 4.3 g/dL (ref 3.5–5.0)
Alkaline Phosphatase: 68 U/L (ref 38–126)
Bilirubin, Direct: 0.2 mg/dL (ref 0.0–0.2)
Indirect Bilirubin: 0.5 mg/dL (ref 0.3–0.9)
Total Bilirubin: 0.7 mg/dL (ref 0.3–1.2)
Total Protein: 7.6 g/dL (ref 6.5–8.1)

## 2020-01-06 LAB — FERRITIN: Ferritin: 1782 ng/mL — ABNORMAL HIGH (ref 11–307)

## 2020-01-06 NOTE — Progress Notes (Signed)
Hematology/Oncology follow-up note [facility] [phone] [phone]   Patient Care Team: Ellamae Sia, MD as PCP - General (Internal Medicine)  REFERRING PROVIDER: Ellamae Sia, MD  CHIEF COMPLAINTS/REASON FOR VISIT:  Follow-up elevated transaminitis, possible iron overload  HISTORY OF PRESENTING ILLNESS:   Mallory Holland is a  49 y.o.  female with PMH listed below was seen in consultation at the request of  Burns, Ala Dach, MD  for evaluation ofTransaminitis, possible iron overload. Patient was recently seen by primary care provider and had blood work done.  Results not available to me.   Patient reports no symptoms.  Feeling well at baseline. Denies any family history of iron overload, liver cancer. Mother deceased from colon cancer.  One of her sisters deceased from cancer, details unknown. Denies any heavy alcohol consumption. Denies weight loss, fever, chills, fatigue, night sweats.   INTERVAL HISTORY Mallory Holland is a 49 y.o. female who has above history reviewed by me today presents for follow up visit for management of transaminitis and elevated ferritin Problems and complaints are listed below: Patient has no new complaints.  She has seen by gastroenterology Dr. Allen Norris during the interval.  Review of Systems  Constitutional: Negative for appetite change, chills, fatigue and fever.  HENT:   Negative for hearing loss and voice change.   Eyes: Negative for eye problems.  Respiratory: Negative for chest tightness and cough.   Cardiovascular: Negative for chest pain.  Gastrointestinal: Negative for abdominal distention, abdominal pain and blood in stool.  Endocrine: Negative for hot flashes.  Genitourinary: Negative for difficulty urinating and frequency.   Musculoskeletal: Negative for arthralgias.  Skin: Negative for itching and rash.  Neurological: Negative for extremity weakness.  Hematological: Negative for  adenopathy.  Psychiatric/Behavioral: Negative for confusion.    MEDICAL HISTORY:  Past Medical History:  Diagnosis Date   Glaucoma    Hypertension     SURGICAL HISTORY: Past Surgical History:  Procedure Laterality Date   ABDOMINAL HYSTERECTOMY     broken ankle     COLONOSCOPY WITH PROPOFOL N/A 02/21/2018   Procedure: COLONOSCOPY WITH PROPOFOL;  Surgeon: Virgel Manifold, MD;  Location: [facility] ENDOSCOPY;  Service: Endoscopy;  Laterality: N/A;    SOCIAL HISTORY: Social History   Socioeconomic History   Marital status: Widowed    Spouse name: Not on file   Number of children: Not on file   Years of education: Not on file   Highest education level: Not on file  Occupational History   Not on file  Tobacco Use   Smoking status: Never Smoker   Smokeless tobacco: Never Used  Substance and Sexual Activity   Alcohol use: No   Drug use: Not on file   Sexual activity: Not on file  Other Topics Concern   Not on file  Social History Narrative   Not on file   Social Determinants of Health   Financial Resource Strain:    Difficulty of Paying Living Expenses:   Food Insecurity:    Worried About [place] in the Last Year:    Human resources officer of Food in the Last Year:   Transportation Needs:    Film/video editor (Medical):    Lack of Transportation (Non-Medical):   Physical Activity:    Days of Exercise per Week:    Minutes of Exercise per Session:   Stress:    Feeling of Stress :   Social Connections:    Frequency of Communication with  Friends and Family:    Frequency of Social Gatherings with Friends and Family:    Attends Religious Services:    Active Member of Clubs or Organizations:    Attends Music therapist:    Marital Status:   Intimate Partner Violence:    Fear of Current or Ex-Partner:    Emotionally Abused:    Physically Abused:    Sexually Abused:     FAMILY HISTORY: Family History  Problem  Relation Age of Onset   Colon cancer Mother    Cancer Sister     ALLERGIES:  has No Known Allergies.  MEDICATIONS:  Current Outpatient Medications  Medication Sig Dispense Refill   brimonidine (ALPHAGAN) 0.2 % ophthalmic solution 3 (three) times daily.     dorzolamide (TRUSOPT) 2 % ophthalmic solution 1 drop 3 (three) times daily.     fluticasone (FLONASE) 50 MCG/ACT nasal spray      ibuprofen (ADVIL) 800 MG tablet Take by mouth.     lisinopril (ZESTRIL) 2.5 MG tablet      naproxen (NAPROSYN) 500 MG tablet Take 1 po BID with food prn pain (Patient not taking: Reported on 05/24/2019) 30 tablet 0   No current facility-administered medications for this visit.     PHYSICAL EXAMINATION: ECOG PERFORMANCE STATUS: 0 - Asymptomatic Vitals:   01/06/20 1300  BP: 122/78  Pulse: 74  Resp: 18  Temp: (!) 96.9 F (36.1 C)   Filed Weights   01/06/20 1300  Weight: 153 lb 11.2 oz (69.7 kg)    Physical Exam Constitutional:      General: She is not in acute distress. HENT:     Head: Normocephalic and atraumatic.  Eyes:     General: No scleral icterus.    Pupils: Pupils are equal, round, and reactive to light.  Cardiovascular:     Rate and Rhythm: Normal rate and regular rhythm.     Heart sounds: Normal heart sounds.  Pulmonary:     Effort: Pulmonary effort is normal. No respiratory distress.     Breath sounds: No wheezing.  Abdominal:     General: Bowel sounds are normal. There is no distension.     Palpations: Abdomen is soft. There is no mass.     Tenderness: There is no abdominal tenderness.  Musculoskeletal:        General: No deformity. Normal range of motion.     Cervical back: Normal range of motion and neck supple.  Skin:    General: Skin is warm and dry.     Findings: No erythema or rash.  Neurological:     Mental Status: She is alert and oriented to person, place, and time.     Cranial Nerves: No cranial nerve deficit.     Coordination: Coordination normal.   Psychiatric:        Behavior: Behavior normal.        Thought Content: Thought content normal.     LABORATORY DATA:  I have reviewed the data as listed Lab Results  Component Value Date   WBC 3.7 (L) 01/06/2020   HGB 10.4 (L) 01/06/2020   HCT 31.0 (L) 01/06/2020   MCV 92.0 01/06/2020   PLT 249 01/06/2020   Recent Labs    05/03/19 1601 07/30/19 1448 01/06/20 1143  NA 141  --   --   K 3.7  --   --   CL 107  --   --   CO2 22  --   --   GLUCOSE  94  --   --   BUN 7  --   --   CREATININE 0.60  --   --   CALCIUM 9.5  --   --   GFRNONAA >60  --   --   GFRAA >60  --   --   PROT 7.6 7.2 7.6  ALBUMIN 4.2 4.7 4.3  AST 92* 167* 249*  ALT 65* 91* 96*  ALKPHOS 46 64 68  BILITOT 0.5 0.3 0.7  BILIDIR  --  0.15 0.2  IBILI  --   --  0.5   Iron/TIBC/Ferritin/ %Sat    Component Value Date/Time   IRON 164 01/06/2020 1143   TIBC 276 01/06/2020 1143   FERRITIN 1,782 (H) 01/06/2020 1143   IRONPCTSAT 60 (H) 01/06/2020 1143      RADIOGRAPHIC STUDIES: I have personally reviewed the radiological images as listed and agreed with the findings in the report.  MM 3D SCREEN BREAST BILATERAL  Result Date: 12/12/2019 CLINICAL DATA:  Screening. EXAM: DIGITAL SCREENING BILATERAL MAMMOGRAM WITH TOMO AND CAD COMPARISON:  Previous exam(s). ACR Breast Density Category b: There are scattered areas of fibroglandular density. FINDINGS: There are no findings suspicious for malignancy. Images were processed with CAD. IMPRESSION: No mammographic evidence of malignancy. A result letter of this screening mammogram will be mailed directly to the patient. RECOMMENDATION: Screening mammogram in one year. (Code:SM-B-01Y) BI-RADS CATEGORY  1: Negative. Electronically Signed   By: Lovey Newcomer M.D.   On: 12/12/2019 16:01      ASSESSMENT & PLAN:  1. Elevated ferritin   2. Normocytic anemia   3. Transaminitis    Labs are reviewed and discussed with patient. Ferritin level has increased to 7082, iron  saturation 60, TIBC 276. AST 249, ALT 96.Patient reports that she has completely stopped alcohol use. Patient is tested negative for hemochromatosis screening testing. Advised patient to proceed with liver biopsy for further evaluation and calcification to see if any iron overload, or other underlying  Liver disease. Check pt ptt   Anemia, normocytic.  Hemoglobin 10.9.  Consistent with anemia secondary to chronic disease. Normal folate, vitamin B12, adequate iron. Multiple myeloma panel negative.  Orders Placed This Encounter  Procedures   Protime-INR    Standing Status:   Future    Standing Expiration Date:   01/05/2021   APTT    Standing Status:   Future    Standing Expiration Date:   01/05/2021    All questions were answered. The patient knows to call the clinic with any problems questions or concerns.    Return of visit: 3 months Earlie Server, MD, PhD Hematology Oncology [facility] Pager- [phone] 01/06/2020

## 2020-01-06 NOTE — Progress Notes (Signed)
Patient denies new problems/concerns today.   °

## 2020-01-07 ENCOUNTER — Other Ambulatory Visit: Payer: Self-pay

## 2020-01-07 ENCOUNTER — Telehealth: Payer: Self-pay

## 2020-01-07 ENCOUNTER — Other Ambulatory Visit: Payer: Self-pay | Admitting: Oncology

## 2020-01-07 DIAGNOSIS — R7401 Elevation of levels of liver transaminase levels: Secondary | ICD-10-CM

## 2020-01-07 DIAGNOSIS — R7989 Other specified abnormal findings of blood chemistry: Secondary | ICD-10-CM

## 2020-01-07 NOTE — Telephone Encounter (Signed)
Please call pt and schedule lab appt for additional testing.   I will send request for Biopsy and call her with an appt once its scheduled. Thanks

## 2020-01-07 NOTE — Telephone Encounter (Signed)
-----   Message from Rickard Patience, MD sent at 01/06/2020  8:55 PM EDT ----- Please arrange her to do additional testing. I ordered pt, ptt.  And schedule her to have CT guided biopsy in 1-2 weeks. Thanks.

## 2020-01-07 NOTE — Telephone Encounter (Signed)
Done.. Per pt request to have her labs drawn on Friday 01/10/20

## 2020-01-07 NOTE — Telephone Encounter (Signed)
Biopsy scheduled for Tues 01-14-20 at 10:30a and she needs to arrive at 9:30a.  The IR nurse will call her with instructions prior to the procedure.  Pt aware of biopsy details.

## 2020-01-10 ENCOUNTER — Other Ambulatory Visit: Payer: Self-pay

## 2020-01-10 ENCOUNTER — Inpatient Hospital Stay: Payer: Medicare HMO

## 2020-01-10 DIAGNOSIS — D649 Anemia, unspecified: Secondary | ICD-10-CM | POA: Diagnosis not present

## 2020-01-10 DIAGNOSIS — R7989 Other specified abnormal findings of blood chemistry: Secondary | ICD-10-CM

## 2020-01-10 DIAGNOSIS — R7401 Elevation of levels of liver transaminase levels: Secondary | ICD-10-CM

## 2020-01-10 LAB — PROTIME-INR
INR: 1 (ref 0.8–1.2)
Prothrombin Time: 12.7 seconds (ref 11.4–15.2)

## 2020-01-10 LAB — APTT: aPTT: 26 seconds (ref 24–36)

## 2020-01-12 ENCOUNTER — Emergency Department (HOSPITAL_COMMUNITY)
Admission: EM | Admit: 2020-01-12 | Discharge: 2020-01-13 | Disposition: A | Discharge status: Home / Self Care | Payer: Medicare Other | Attending: Emergency Medicine | Admitting: Emergency Medicine

## 2020-01-12 ENCOUNTER — Other Ambulatory Visit: Payer: Self-pay

## 2020-01-12 ENCOUNTER — Encounter (HOSPITAL_COMMUNITY): Payer: Self-pay | Admitting: Emergency Medicine

## 2020-01-12 DIAGNOSIS — Y999 Unspecified external cause status: Secondary | ICD-10-CM | POA: Diagnosis not present

## 2020-01-12 DIAGNOSIS — I10 Essential (primary) hypertension: Secondary | ICD-10-CM | POA: Diagnosis not present

## 2020-01-12 DIAGNOSIS — S0121XA Laceration without foreign body of nose, initial encounter: Secondary | ICD-10-CM | POA: Diagnosis not present

## 2020-01-12 DIAGNOSIS — S0033XA Contusion of nose, initial encounter: Secondary | ICD-10-CM

## 2020-01-12 DIAGNOSIS — Y9289 Other specified places as the place of occurrence of the external cause: Secondary | ICD-10-CM | POA: Diagnosis not present

## 2020-01-12 DIAGNOSIS — Z79899 Other long term (current) drug therapy: Secondary | ICD-10-CM | POA: Diagnosis not present

## 2020-01-12 DIAGNOSIS — Y9389 Activity, other specified: Secondary | ICD-10-CM | POA: Insufficient documentation
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
## 2020-01-13 ENCOUNTER — Other Ambulatory Visit: Payer: Self-pay | Admitting: Radiology

## 2020-01-13 ENCOUNTER — Emergency Department (HOSPITAL_COMMUNITY): Payer: Medicare Other

## 2020-01-13 ENCOUNTER — Telehealth: Payer: Self-pay

## 2020-01-13 ENCOUNTER — Other Ambulatory Visit: Payer: Self-pay | Admitting: Physician Assistant

## 2020-01-13 DIAGNOSIS — S0121XA Laceration without foreign body of nose, initial encounter: Secondary | ICD-10-CM | POA: Diagnosis not present

## 2020-01-13 MED ORDER — LIDOCAINE-EPINEPHRINE (PF) 2 %-1:200000 IJ SOLN
10.0000 mL | Freq: Once | INTRAMUSCULAR | Status: AC
  Administered 2020-01-13: 10 mL
  Filled 2020-01-13: qty 10

## 2020-01-13 MED ORDER — TETANUS-DIPHTH-ACELL PERTUSSIS 5-2.5-18.5 LF-MCG/0.5 IM SUSP
0.5000 mL | Freq: Once | INTRAMUSCULAR | Status: AC
  Administered 2020-01-13: 0.5 mL via INTRAMUSCULAR
  Filled 2020-01-13: qty 0.5

## 2020-01-13 MED ORDER — BACITRACIN ZINC 500 UNIT/GM EX OINT
TOPICAL_OINTMENT | Freq: Once | CUTANEOUS | Status: DC
  Filled 2020-01-13: qty 0.9

## 2020-01-13 NOTE — Telephone Encounter (Signed)
Received letter from patient's PCP (Dr. Lawerance Bach @ [facility]) requesting for lipid panel to be drawn, but pt had labs drawn already on 7/30. Called [facility] to notify them that labs was not drawn.   (letter scanned in media)

## 2020-01-13 NOTE — Discharge Instructions (Addendum)
Apply ice for 30 minutes at a time, 4 times a day.  Take acetaminophen or ibuprofen or naproxen as needed for pain.  Apply an antibiotic ointment to the laceration twice a day.  You may use either bacitracin or Neosporin.  You do not need prescriptions for these.  Your sutures will dissolve on their own.  You do not have to have them removed.
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
## 2020-01-14 ENCOUNTER — Ambulatory Visit
Admission: RE | Admit: 2020-01-14 | Discharge: 2020-01-14 | Disposition: A | Discharge status: Home / Self Care | Payer: Medicare Other | Source: Ambulatory Visit | Attending: Oncology | Admitting: Oncology

## 2020-01-14 ENCOUNTER — Other Ambulatory Visit: Payer: Self-pay

## 2020-01-14 DIAGNOSIS — R7401 Elevation of levels of liver transaminase levels: Secondary | ICD-10-CM

## 2020-01-14 DIAGNOSIS — I10 Essential (primary) hypertension: Secondary | ICD-10-CM | POA: Insufficient documentation

## 2020-01-14 DIAGNOSIS — Z79899 Other long term (current) drug therapy: Secondary | ICD-10-CM | POA: Diagnosis not present

## 2020-01-14 DIAGNOSIS — K760 Fatty (change of) liver, not elsewhere classified: Secondary | ICD-10-CM | POA: Diagnosis not present

## 2020-01-14 DIAGNOSIS — R7989 Other specified abnormal findings of blood chemistry: Secondary | ICD-10-CM | POA: Diagnosis present

## 2020-01-14 DIAGNOSIS — H409 Unspecified glaucoma: Secondary | ICD-10-CM | POA: Diagnosis not present

## 2020-01-14 DIAGNOSIS — Z791 Long term (current) use of non-steroidal anti-inflammatories (NSAID): Secondary | ICD-10-CM | POA: Diagnosis not present

## 2020-01-14 MED ORDER — FENTANYL CITRATE (PF) 100 MCG/2ML IJ SOLN
INTRAMUSCULAR | Status: AC | PRN
  Administered 2020-01-14: 50 ug via INTRAVENOUS

## 2020-01-14 MED ORDER — SODIUM CHLORIDE 0.9 % IV SOLN: INTRAVENOUS | Status: DC

## 2020-01-14 MED ORDER — FENTANYL CITRATE (PF) 100 MCG/2ML IJ SOLN
INTRAMUSCULAR | Status: AC
  Filled 2020-01-14: qty 2

## 2020-01-14 MED ORDER — MIDAZOLAM HCL 5 MG/5ML IJ SOLN
INTRAMUSCULAR | Status: AC
  Filled 2020-01-14: qty 5

## 2020-01-14 MED ORDER — MIDAZOLAM HCL 5 MG/5ML IJ SOLN
INTRAMUSCULAR | Status: AC | PRN
  Administered 2020-01-14: 1 mg via INTRAVENOUS

## 2020-01-14 NOTE — Procedures (Signed)
Interventional Radiology Procedure Note  Procedure: Ultrasound guided random liver biopsy    Complications: None  Estimated Blood Loss:  Minimal  Findings: Technically successful 18 ga core bx x2    M. Ruel Favors, MD

## 2020-01-14 NOTE — Discharge Instructions (Signed)
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
## 2020-01-16 LAB — SURGICAL PATHOLOGY

## 2020-01-20 ENCOUNTER — Telehealth: Payer: Self-pay

## 2020-01-20 NOTE — Telephone Encounter (Signed)
-----   Message from Rickard Patience, MD sent at 01/17/2020  3:49 PM EDT ----- Please let her know that liver biopsy showed no iron overload in her liver. Fatty liver disease. She does not need to have blood removed. Follow up plan as is.

## 2020-01-20 NOTE — Telephone Encounter (Signed)
Pt informed

## 2020-01-22 ENCOUNTER — Telehealth: Payer: Self-pay

## 2020-01-22 NOTE — Telephone Encounter (Signed)
-----   Message from Midge Minium, MD sent at 01/20/2020 12:07 PM EDT ----- Let the patient know that her liver biopsy did not show any increased iron stores or fibrosis.  It was consistent with fatty liver.  Treatments for fatty liver include a Mediterranean diet, keeping risk factors under control.

## 2020-01-22 NOTE — Telephone Encounter (Signed)
Pt notified of liver biopsy results.  

## 2020-04-06 ENCOUNTER — Inpatient Hospital Stay: Payer: Medicare Other | Attending: Oncology

## 2020-04-06 ENCOUNTER — Other Ambulatory Visit: Payer: Self-pay

## 2020-04-06 DIAGNOSIS — R7989 Other specified abnormal findings of blood chemistry: Secondary | ICD-10-CM

## 2020-04-06 DIAGNOSIS — D649 Anemia, unspecified: Secondary | ICD-10-CM | POA: Diagnosis not present

## 2020-04-06 LAB — CBC WITH DIFFERENTIAL/PLATELET
Abs Immature Granulocytes: 0.01 10*3/uL (ref 0.00–0.07)
Basophils Absolute: 0 10*3/uL (ref 0.0–0.1)
Basophils Relative: 1 %
Eosinophils Absolute: 0 10*3/uL (ref 0.0–0.5)
Eosinophils Relative: 1 %
HCT: 30.5 % — ABNORMAL LOW (ref 36.0–46.0)
Hemoglobin: 10.4 g/dL — ABNORMAL LOW (ref 12.0–15.0)
Immature Granulocytes: 0 %
Lymphocytes Relative: 47 %
Lymphs Abs: 2.1 10*3/uL (ref 0.7–4.0)
MCH: 31.2 pg (ref 26.0–34.0)
MCHC: 34.1 g/dL (ref 30.0–36.0)
MCV: 91.6 fL (ref 80.0–100.0)
Monocytes Absolute: 0.5 10*3/uL (ref 0.1–1.0)
Monocytes Relative: 12 %
Neutro Abs: 1.8 10*3/uL (ref 1.7–7.7)
Neutrophils Relative %: 39 %
Platelets: 266 10*3/uL (ref 150–400)
RBC: 3.33 MIL/uL — ABNORMAL LOW (ref 3.87–5.11)
RDW: 14.6 % (ref 11.5–15.5)
WBC: 4.5 10*3/uL (ref 4.0–10.5)
nRBC: 0 % (ref 0.0–0.2)

## 2020-04-06 LAB — FERRITIN: Ferritin: 799 ng/mL — ABNORMAL HIGH (ref 11–307)

## 2020-04-06 LAB — COMPREHENSIVE METABOLIC PANEL
ALT: 32 U/L (ref 0–44)
AST: 69 U/L — ABNORMAL HIGH (ref 15–41)
Albumin: 4.1 g/dL (ref 3.5–5.0)
Alkaline Phosphatase: 53 U/L (ref 38–126)
Anion gap: 10 (ref 5–15)
BUN: 7 mg/dL (ref 6–20)
CO2: 24 mmol/L (ref 22–32)
Calcium: 8.9 mg/dL (ref 8.9–10.3)
Chloride: 104 mmol/L (ref 98–111)
Creatinine, Ser: 0.45 mg/dL (ref 0.44–1.00)
GFR, Estimated: >60 mL/min (ref >60)
Glucose, Bld: 98 mg/dL (ref 70–99)
Potassium: 3.8 mmol/L (ref 3.5–5.1)
Sodium: 138 mmol/L (ref 135–145)
Total Bilirubin: 0.6 mg/dL (ref 0.3–1.2)
Total Protein: 7.5 g/dL (ref 6.5–8.1)

## 2020-04-06 LAB — IRON AND TIBC
Iron: 164 ug/dL (ref 28–170)
Saturation Ratios: 54 % — ABNORMAL HIGH (ref 10.4–31.8)
TIBC: 307 ug/dL (ref 250–450)
UIBC: 143 ug/dL

## 2020-04-07 ENCOUNTER — Inpatient Hospital Stay: Payer: Medicare Other | Admitting: Oncology

## 2020-07-31 ENCOUNTER — Emergency Department (HOSPITAL_COMMUNITY)
Admission: EM | Admit: 2020-07-31 | Discharge: 2020-07-31 | Disposition: A | Discharge status: Home / Self Care | Payer: 59 | Attending: Emergency Medicine | Admitting: Emergency Medicine

## 2020-07-31 ENCOUNTER — Other Ambulatory Visit: Payer: Self-pay

## 2020-07-31 ENCOUNTER — Encounter (HOSPITAL_COMMUNITY): Payer: Self-pay

## 2020-07-31 ENCOUNTER — Emergency Department (HOSPITAL_COMMUNITY): Payer: 59

## 2020-07-31 DIAGNOSIS — S93402A Sprain of unspecified ligament of left ankle, initial encounter: Secondary | ICD-10-CM | POA: Insufficient documentation

## 2020-07-31 DIAGNOSIS — Z79899 Other long term (current) drug therapy: Secondary | ICD-10-CM | POA: Insufficient documentation

## 2020-07-31 DIAGNOSIS — I10 Essential (primary) hypertension: Secondary | ICD-10-CM | POA: Diagnosis not present

## 2020-07-31 DIAGNOSIS — W500XXA Accidental hit or strike by another person, initial encounter: Secondary | ICD-10-CM | POA: Diagnosis not present

## 2020-07-31 DIAGNOSIS — S81812A Laceration without foreign body, left lower leg, initial encounter: Secondary | ICD-10-CM | POA: Diagnosis not present

## 2020-07-31 DIAGNOSIS — S8992XA Unspecified injury of left lower leg, initial encounter: Secondary | ICD-10-CM | POA: Diagnosis present

## 2020-07-31 MED ORDER — POVIDONE-IODINE 10 % EX SOLN
CUTANEOUS | Status: DC | PRN
  Filled 2020-07-31: qty 15

## 2020-07-31 MED ORDER — LIDOCAINE-EPINEPHRINE (PF) 2 %-1:200000 IJ SOLN
INTRAMUSCULAR | Status: AC
  Administered 2020-07-31: 10 mL via INTRADERMAL
  Filled 2020-07-31: qty 20

## 2020-07-31 MED ORDER — LIDOCAINE-EPINEPHRINE (PF) 2 %-1:200000 IJ SOLN: 10.0000 mL | Freq: Once | INTRAMUSCULAR | Status: AC
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
## 2020-07-31 NOTE — Discharge Instructions (Addendum)
The x-rays of your lower leg and ankle today did not show evidence of any broken bones or dislocations.  You have a laceration to your lower leg with stitches in place.  Clean the wound with mild soap and water and keep it bandaged.  Sutures will need to be removed in 8 to 10 days.  You may take Tylenol or ibuprofen if needed for pain.  Elevate your leg when possible.  Follow-up with your primary doctor for recheck or return to the emergency department if you develop worsening symptoms such as redness, swelling, fever, chills, or increasing pain to your leg
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
## 2020-11-17 ENCOUNTER — Other Ambulatory Visit: Payer: Self-pay | Admitting: Internal Medicine

## 2020-11-17 DIAGNOSIS — Z1231 Encounter for screening mammogram for malignant neoplasm of breast: Secondary | ICD-10-CM

## 2020-12-16 ENCOUNTER — Ambulatory Visit
Admission: RE | Admit: 2020-12-16 | Discharge: 2020-12-16 | Disposition: A | Discharge status: Home / Self Care | Payer: Medicare HMO | Source: Ambulatory Visit | Attending: Internal Medicine | Admitting: Internal Medicine

## 2020-12-16 ENCOUNTER — Other Ambulatory Visit: Payer: Self-pay

## 2020-12-16 DIAGNOSIS — Z1231 Encounter for screening mammogram for malignant neoplasm of breast: Secondary | ICD-10-CM | POA: Diagnosis present

## 2021-08-07 IMAGING — US US ABDOMEN COMPLETE
1 series · 14 of 25 positions shown · non-contrast
Comparison: No prior.

CLINICAL DATA: Elevated transaminases.  History of alcohol abuse.

EXAM:
ABDOMEN ULTRASOUND COMPLETE

[Series 1: us abdomen complete · 0.19mm/px · 14 of 90 slices shown]
[im 1/90]
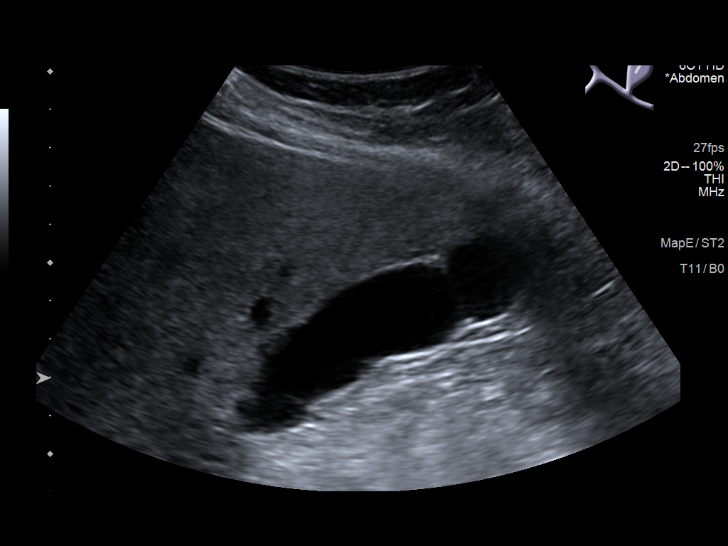
[im 8/90]
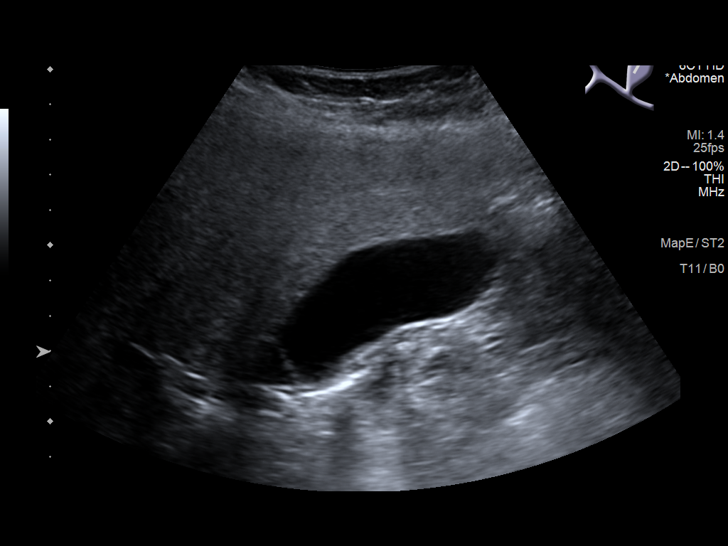
[im 15/90]
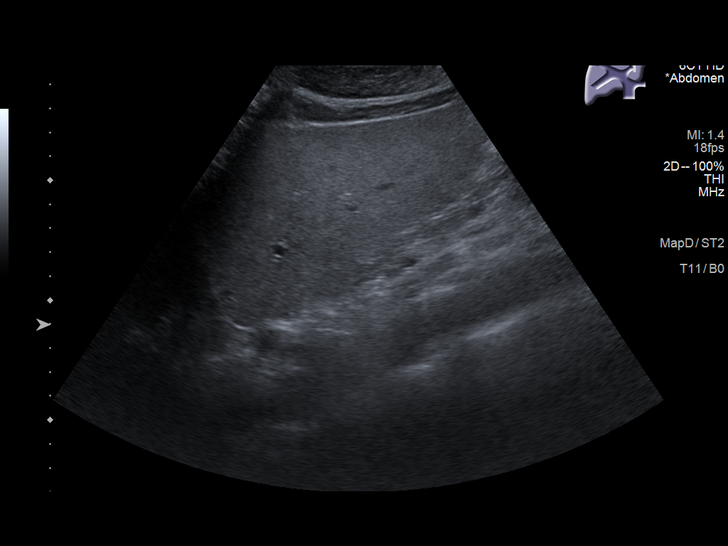
[im 23/90]
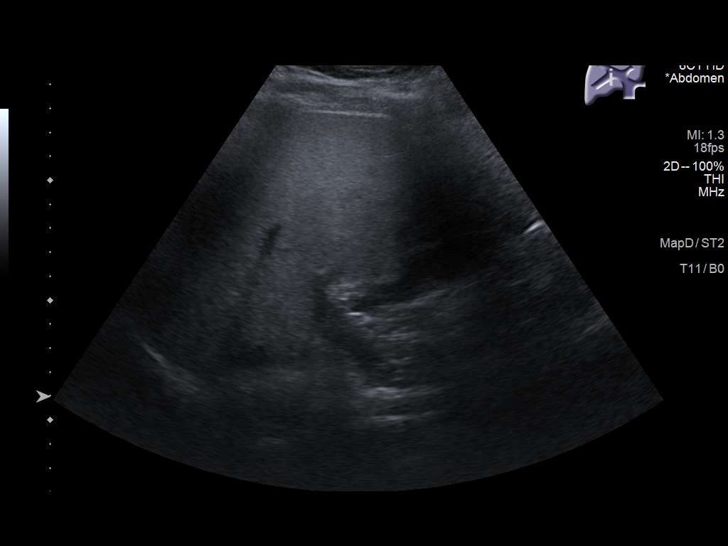
[im 30/90]
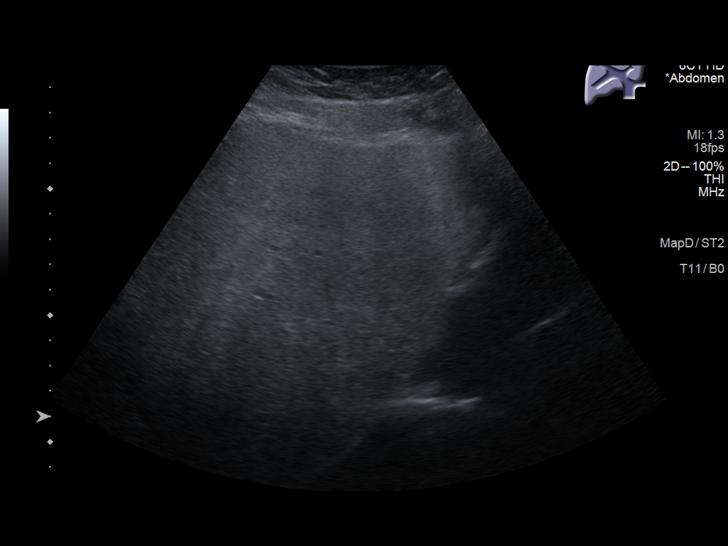
[im 34/90]
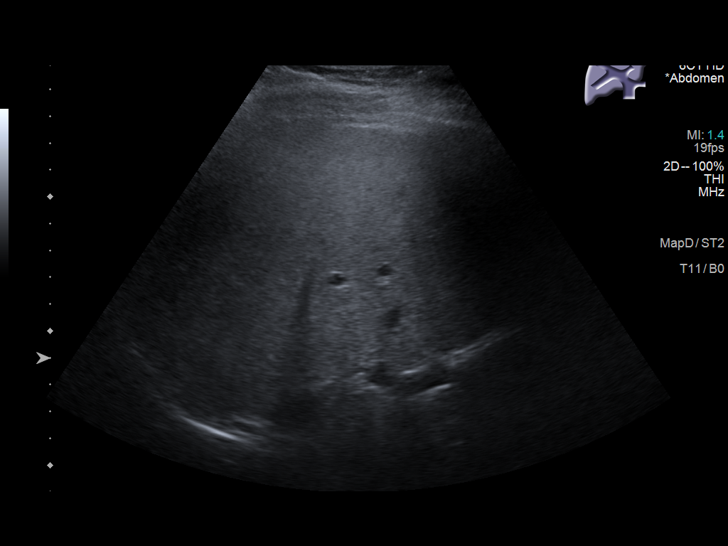
[im 41/90]
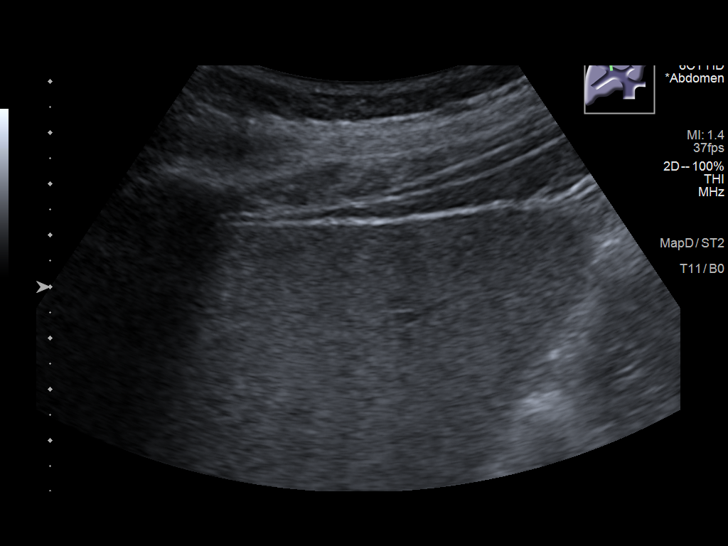
[im 49/90]
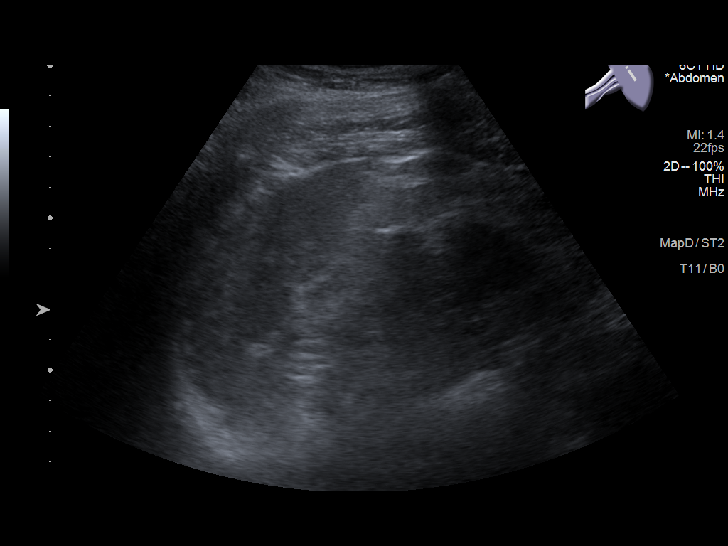
[im 56/90]
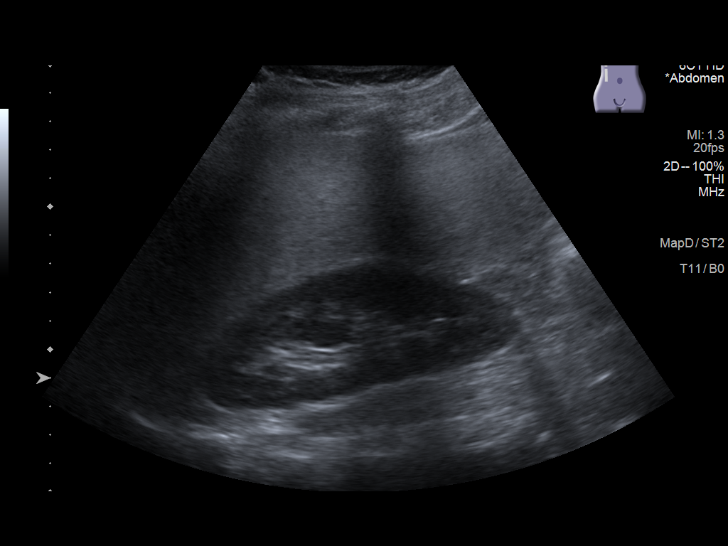
[im 60/90]
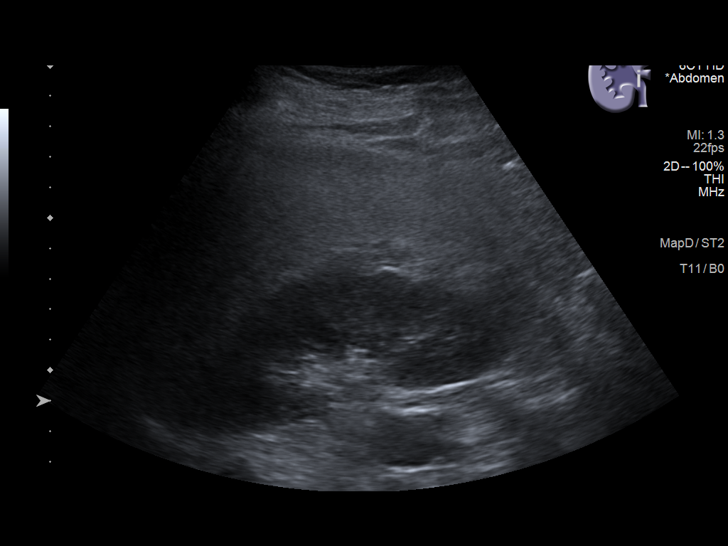
[im 67/90]
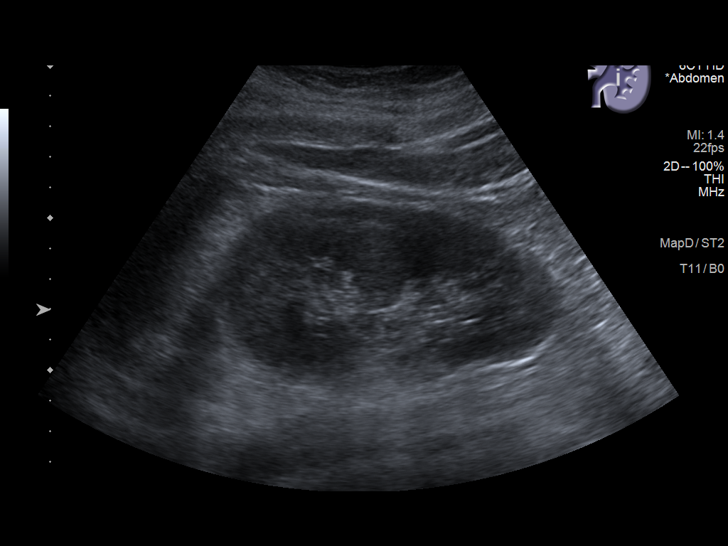
[im 75/90]
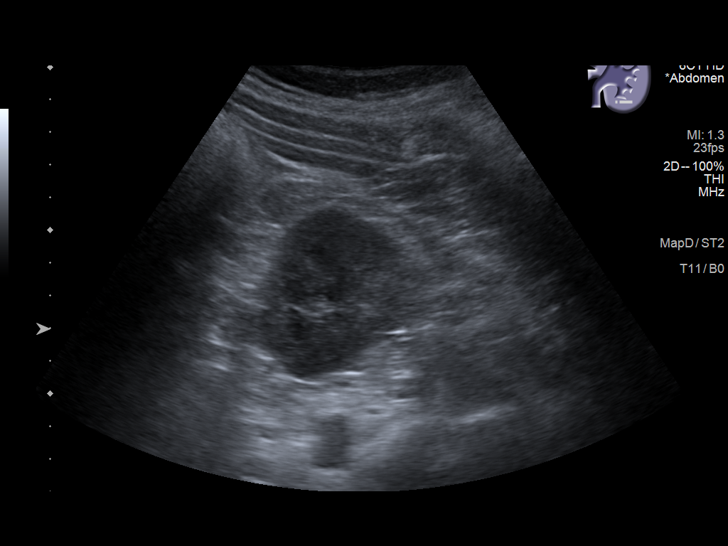
[im 82/90]
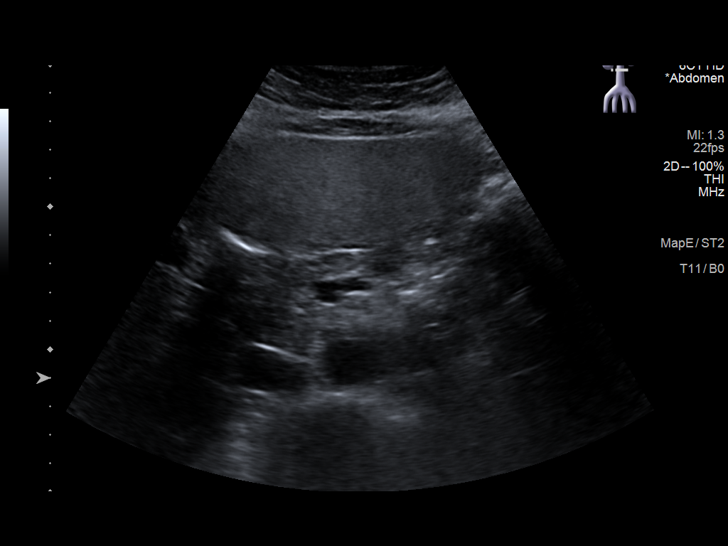
[im 90/90]
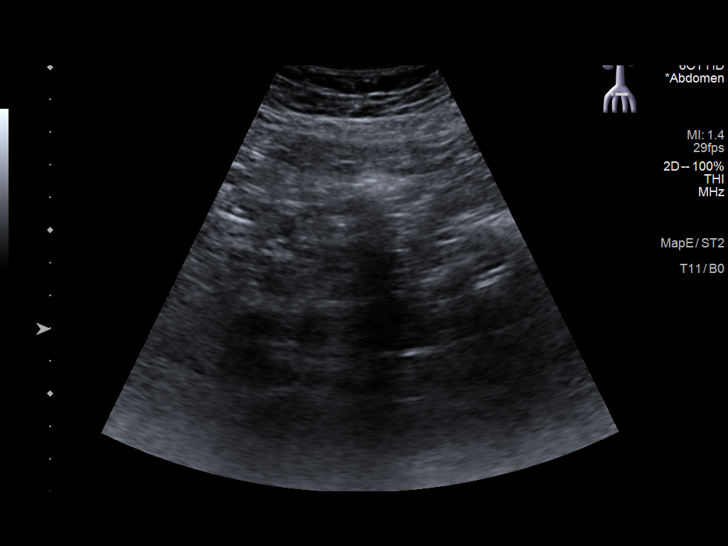

[14 of 25 positions shown; findings below may reference images not displayed]

FINDINGS: Gallbladder: No gallstones or wall thickening visualized. No
sonographic Murphy sign noted by sonographer.

Common bile duct: Diameter: 5.0 mm

Liver: Increased echogenicity consistent fatty infiltration or
hepatocellular disease. No focal hepatic abnormality identified.
Portal vein is patent on color Doppler imaging with normal direction
of blood flow towards the liver.

IVC: No abnormality visualized.

Pancreas: Visualized portion unremarkable.

Spleen: Size and appearance within normal limits.

Right Kidney: Length: 10.3 cm. Echogenicity within normal limits. No
mass or hydronephrosis visualized.

Left Kidney: Length: 11.0 cm. Echogenicity within normal limits. No
mass or hydronephrosis visualized.

Abdominal aorta: No aneurysm visualized.

Other findings: None.
IMPRESSION: 1.  No gallstones or biliary distention.

2. Increased hepatic echogenicity consistent with fatty infiltration
or hepatocellular disease. No focal hepatic abnormality identified.

## 2022-01-21 ENCOUNTER — Other Ambulatory Visit: Payer: Self-pay | Admitting: Nurse Practitioner

## 2022-01-21 DIAGNOSIS — Z1231 Encounter for screening mammogram for malignant neoplasm of breast: Secondary | ICD-10-CM

## 2022-04-07 ENCOUNTER — Other Ambulatory Visit: Payer: Self-pay | Admitting: Family Medicine

## 2022-04-07 DIAGNOSIS — Z1231 Encounter for screening mammogram for malignant neoplasm of breast: Secondary | ICD-10-CM

## 2022-06-16 ENCOUNTER — Ambulatory Visit
Admission: RE | Admit: 2022-06-16 | Discharge: 2022-06-16 | Disposition: A | Discharge status: Home / Self Care | Payer: Medicare Other | Source: Ambulatory Visit | Attending: Family Medicine | Admitting: Family Medicine

## 2022-06-16 DIAGNOSIS — Z1231 Encounter for screening mammogram for malignant neoplasm of breast: Secondary | ICD-10-CM | POA: Diagnosis not present

## 2023-02-18 IMAGING — MG MM DIGITAL SCREENING BILAT W/ TOMO AND CAD
8 series · 8 of 24 positions shown · non-contrast
Comparison: Previous exam(s).

CLINICAL DATA: Screening.

EXAM:
DIGITAL SCREENING BILATERAL MAMMOGRAM WITH TOMOSYNTHESIS AND CAD
TECHNIQUE: Bilateral screening digital craniocaudal and mediolateral oblique
mammograms were obtained. Bilateral screening digital breast
tomosynthesis was performed. The images were evaluated with
computer-aided detection.

[R MLO synth-2D]
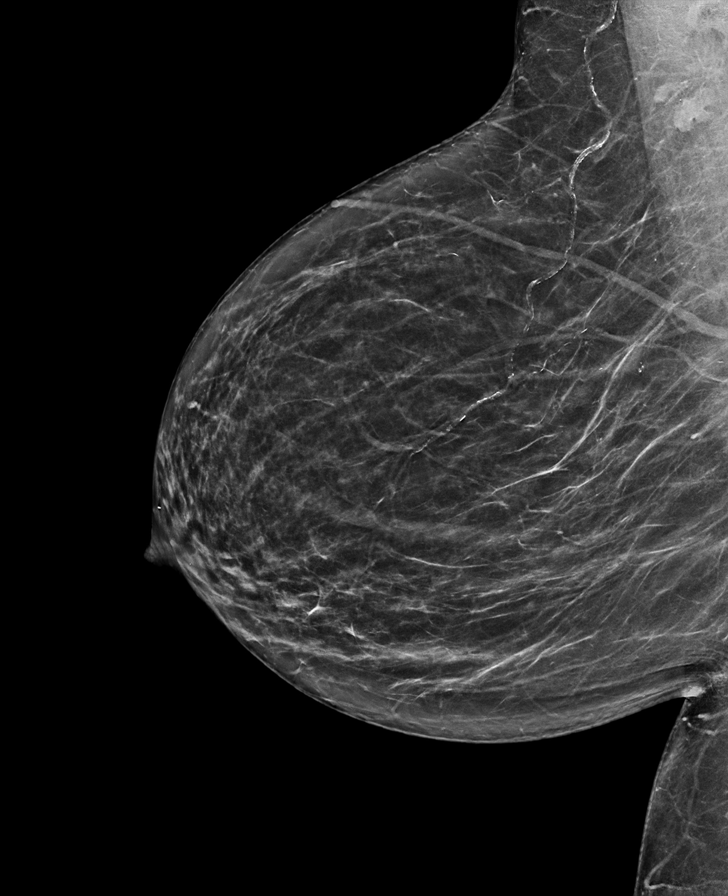

[L MLO synth-2D]
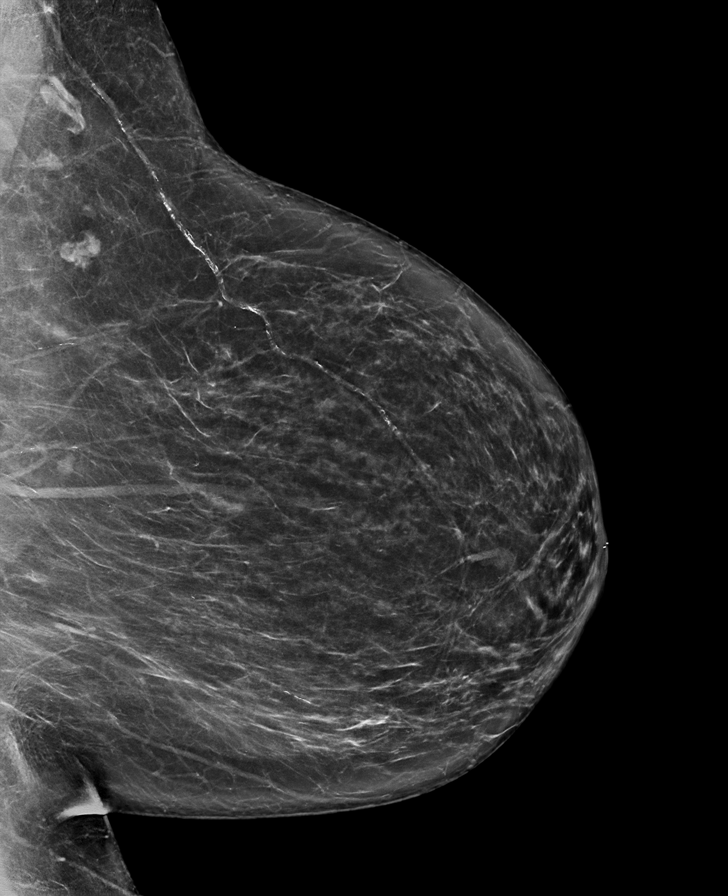

[L CC synth-2D]
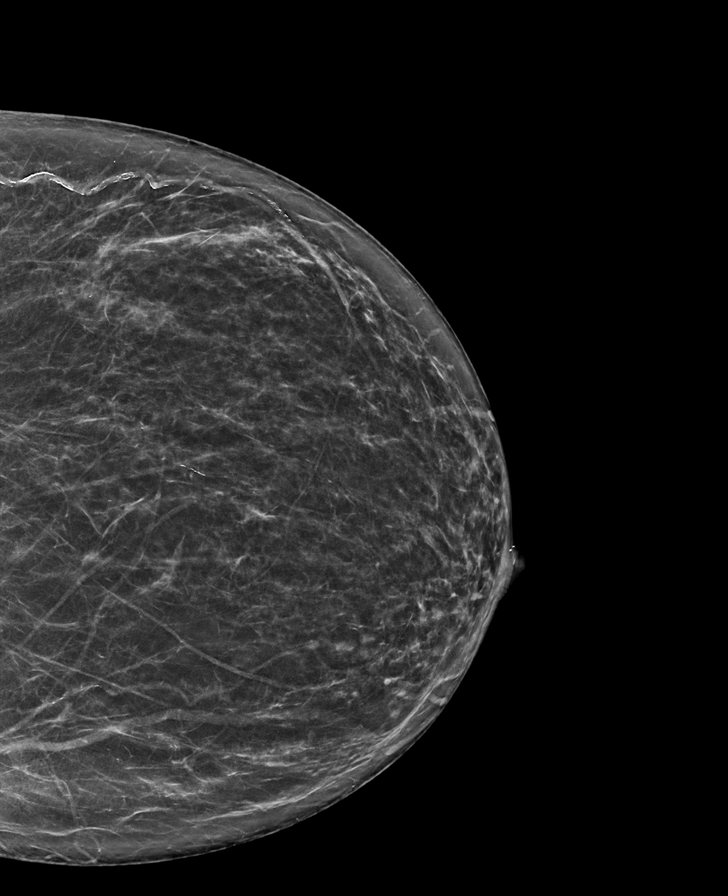

[R CC synth-2D]
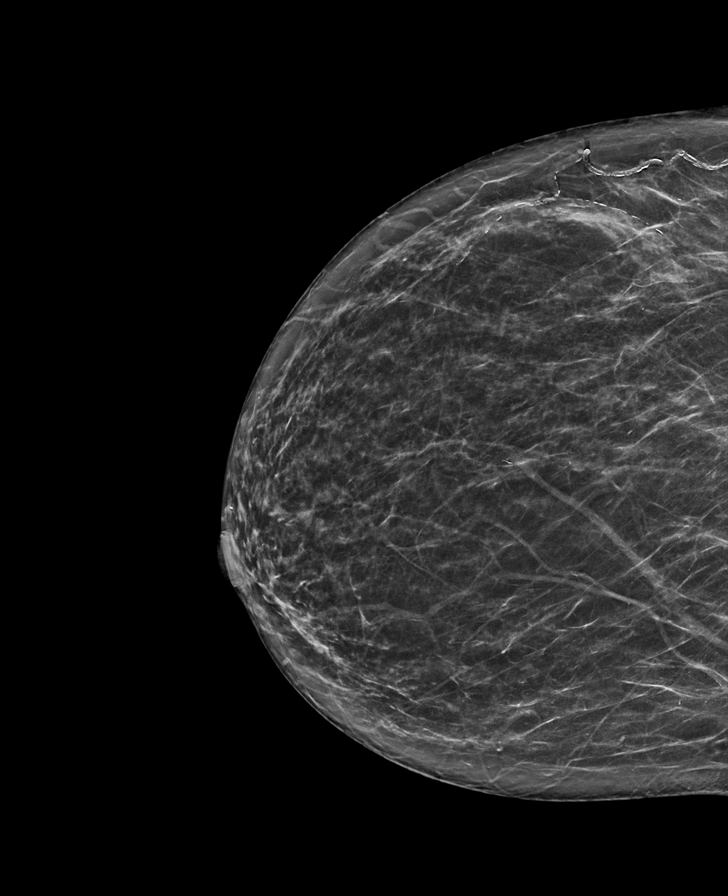

[R MLO tomo · tomo slice 37/74.0]
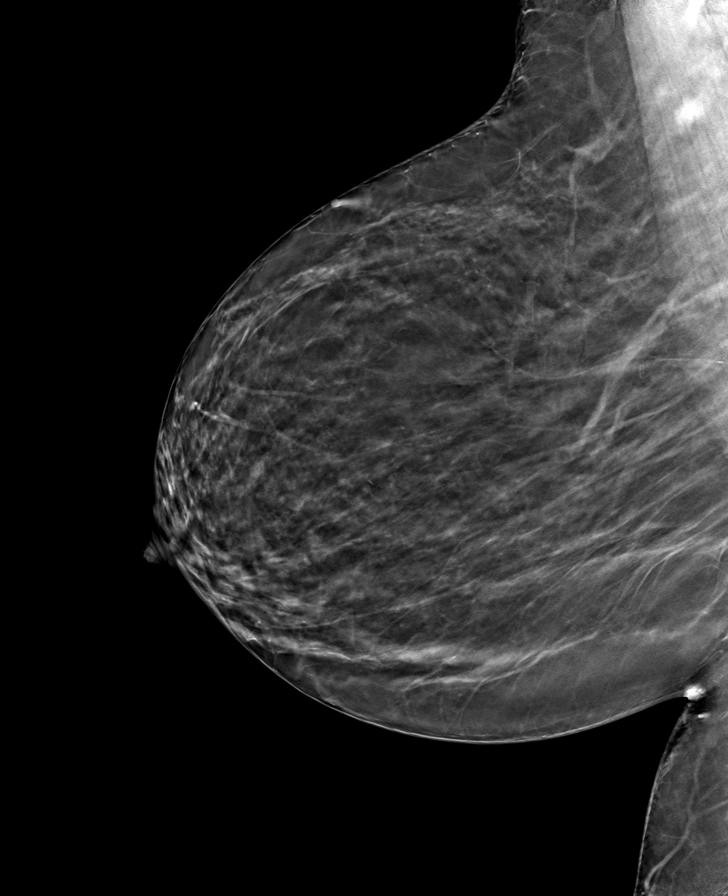

[L MLO tomo · tomo slice 40/79.0]
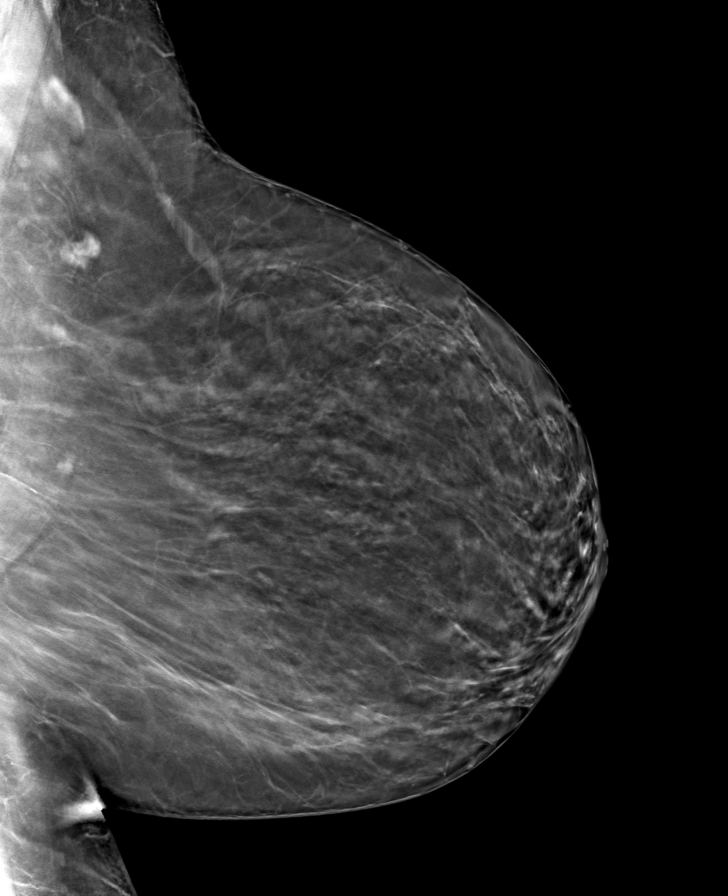

[L CC tomo · tomo slice 35/68.0]
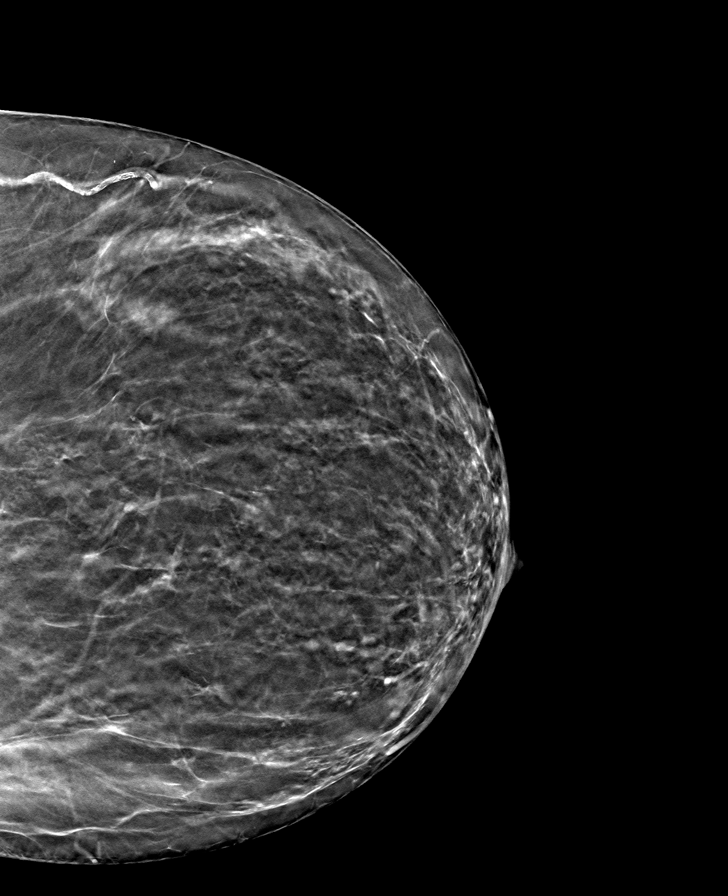

[R CC tomo · tomo slice 35/68.0]
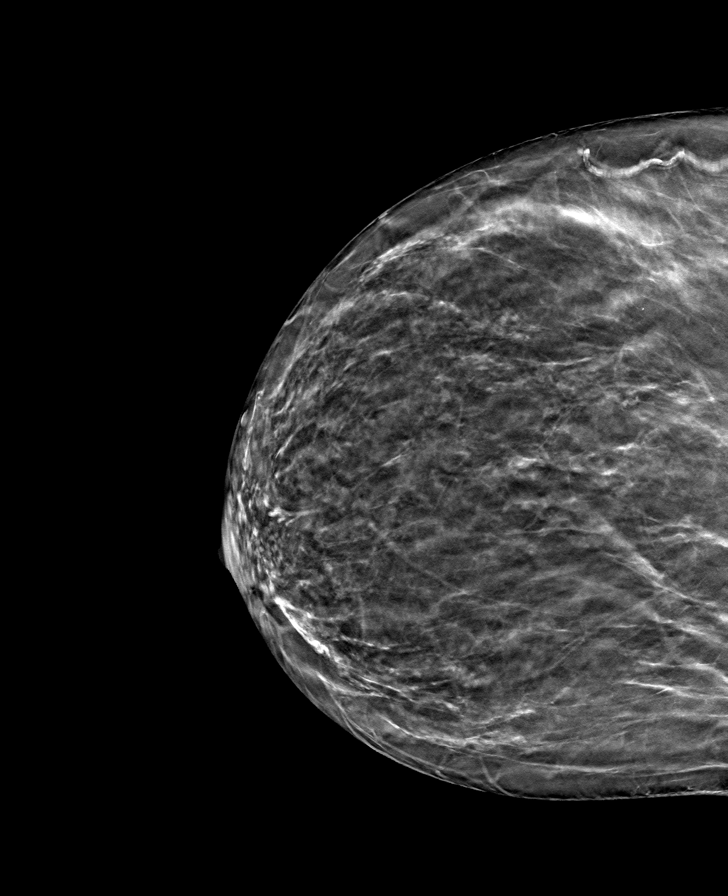

[8 of 24 positions shown; findings below may reference images not displayed]

ACR Breast Density Category b: There are scattered areas of
fibroglandular density.
FINDINGS: There are no findings suspicious for malignancy.
IMPRESSION: No mammographic evidence of malignancy. A result letter of this
screening mammogram will be mailed directly to the patient.

RECOMMENDATION:
Screening mammogram in one year. (Code:51-O-LD2)

BI-RADS CATEGORY  1: Negative.

## 2023-08-28 ENCOUNTER — Emergency Department (HOSPITAL_COMMUNITY)
Admission: EM | Admit: 2023-08-28 | Discharge: 2023-08-28 | Disposition: A | Discharge status: Home / Self Care | Attending: Emergency Medicine | Admitting: Emergency Medicine

## 2023-08-28 ENCOUNTER — Encounter (HOSPITAL_COMMUNITY): Payer: Self-pay | Admitting: Emergency Medicine

## 2023-08-28 ENCOUNTER — Emergency Department (HOSPITAL_COMMUNITY)

## 2023-08-28 ENCOUNTER — Other Ambulatory Visit: Payer: Self-pay

## 2023-08-28 DIAGNOSIS — Y908 Blood alcohol level of 240 mg/100 ml or more: Secondary | ICD-10-CM | POA: Diagnosis not present

## 2023-08-28 DIAGNOSIS — S0990XA Unspecified injury of head, initial encounter: Secondary | ICD-10-CM | POA: Diagnosis present

## 2023-08-28 DIAGNOSIS — W19XXXA Unspecified fall, initial encounter: Secondary | ICD-10-CM | POA: Diagnosis not present

## 2023-08-28 DIAGNOSIS — F10920 Alcohol use, unspecified with intoxication, uncomplicated: Secondary | ICD-10-CM

## 2023-08-28 DIAGNOSIS — M25531 Pain in right wrist: Secondary | ICD-10-CM | POA: Insufficient documentation

## 2023-08-28 DIAGNOSIS — M25532 Pain in left wrist: Secondary | ICD-10-CM | POA: Diagnosis not present

## 2023-08-28 DIAGNOSIS — D649 Anemia, unspecified: Secondary | ICD-10-CM

## 2023-08-28 DIAGNOSIS — F10120 Alcohol abuse with intoxication, uncomplicated: Secondary | ICD-10-CM | POA: Insufficient documentation

## 2023-08-28 LAB — COMPREHENSIVE METABOLIC PANEL
ALT: 31 U/L (ref 0–44)
AST: 51 U/L — ABNORMAL HIGH (ref 15–41)
Albumin: 3.7 g/dL (ref 3.5–5.0)
Alkaline Phosphatase: 72 U/L (ref 38–126)
Anion gap: 14 (ref 5–15)
BUN: 13 mg/dL (ref 6–20)
CO2: 20 mmol/L — ABNORMAL LOW (ref 22–32)
Calcium: 9.8 mg/dL (ref 8.9–10.3)
Chloride: 108 mmol/L (ref 98–111)
Creatinine, Ser: 0.6 mg/dL (ref 0.44–1.00)
GFR, Estimated: >60 mL/min (ref >60)
Glucose, Bld: 94 mg/dL (ref 70–99)
Potassium: 3.9 mmol/L (ref 3.5–5.1)
Sodium: 142 mmol/L (ref 135–145)
Total Bilirubin: <0.2 mg/dL (ref 0.0–1.2)
Total Protein: 7 g/dL (ref 6.5–8.1)

## 2023-08-28 LAB — CBC WITH DIFFERENTIAL/PLATELET
Abs Immature Granulocytes: 0.01 10*3/uL (ref 0.00–0.07)
Basophils Absolute: 0.1 10*3/uL (ref 0.0–0.1)
Basophils Relative: 1 %
Eosinophils Absolute: 0 10*3/uL (ref 0.0–0.5)
Eosinophils Relative: 1 %
HCT: 26.8 % — ABNORMAL LOW (ref 36.0–46.0)
Hemoglobin: 8.8 g/dL — ABNORMAL LOW (ref 12.0–15.0)
Immature Granulocytes: 0 %
Lymphocytes Relative: 57 %
Lymphs Abs: 2.3 10*3/uL (ref 0.7–4.0)
MCH: 30.2 pg (ref 26.0–34.0)
MCHC: 32.8 g/dL (ref 30.0–36.0)
MCV: 92.1 fL (ref 80.0–100.0)
Monocytes Absolute: 0.4 10*3/uL (ref 0.1–1.0)
Monocytes Relative: 10 %
Neutro Abs: 1.3 10*3/uL — ABNORMAL LOW (ref 1.7–7.7)
Neutrophils Relative %: 31 %
Platelets: 358 10*3/uL (ref 150–400)
RBC: 2.91 MIL/uL — ABNORMAL LOW (ref 3.87–5.11)
RDW: 14.5 % (ref 11.5–15.5)
WBC: 4.1 10*3/uL (ref 4.0–10.5)
nRBC: 0 % (ref 0.0–0.2)

## 2023-08-28 LAB — ETHANOL: Alcohol, Ethyl (B): 338 mg/dL (ref <10)

## 2023-08-28 MED ORDER — FOLIC ACID 1 MG PO TABS
1.0000 mg | ORAL_TABLET | Freq: Every day | ORAL | Status: DC
  Administered 2023-08-28: 1 mg via ORAL
  Filled 2023-08-28: qty 1
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
## 2023-09-12 ENCOUNTER — Other Ambulatory Visit: Payer: Self-pay | Admitting: Family Medicine

## 2023-09-12 DIAGNOSIS — Z1231 Encounter for screening mammogram for malignant neoplasm of breast: Secondary | ICD-10-CM

## 2023-09-15 ENCOUNTER — Other Ambulatory Visit: Payer: Self-pay

## 2023-09-15 ENCOUNTER — Emergency Department (HOSPITAL_COMMUNITY)

## 2023-09-15 ENCOUNTER — Emergency Department (HOSPITAL_COMMUNITY)
Admission: EM | Admit: 2023-09-15 | Discharge: 2023-09-15 | Disposition: A | Discharge status: Home / Self Care | Attending: Emergency Medicine | Admitting: Emergency Medicine

## 2023-09-15 ENCOUNTER — Encounter (HOSPITAL_COMMUNITY): Payer: Self-pay

## 2023-09-15 DIAGNOSIS — S0081XA Abrasion of other part of head, initial encounter: Secondary | ICD-10-CM | POA: Diagnosis not present

## 2023-09-15 DIAGNOSIS — Z79899 Other long term (current) drug therapy: Secondary | ICD-10-CM | POA: Insufficient documentation

## 2023-09-15 DIAGNOSIS — S0990XA Unspecified injury of head, initial encounter: Secondary | ICD-10-CM | POA: Diagnosis present

## 2023-09-15 DIAGNOSIS — I10 Essential (primary) hypertension: Secondary | ICD-10-CM | POA: Diagnosis not present

## 2023-09-15 NOTE — ED Notes (Signed)
 Called to room with pt reporting bleeding from head. Given 4 x 4s and pt is applying pressure.
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
## 2023-09-21 ENCOUNTER — Ambulatory Visit
Admission: RE | Admit: 2023-09-21 | Discharge: 2023-09-21 | Disposition: A | Discharge status: Home / Self Care | Source: Ambulatory Visit | Attending: Family Medicine | Admitting: Family Medicine

## 2023-09-21 DIAGNOSIS — Z1231 Encounter for screening mammogram for malignant neoplasm of breast: Secondary | ICD-10-CM | POA: Diagnosis present

## 2023-10-11 ENCOUNTER — Encounter: Payer: Self-pay | Admitting: Ophthalmology
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
## 2023-10-17 NOTE — Discharge Instructions (Signed)

## 2023-10-19 NOTE — H&P (Signed)
 [facility]   Primary Care Physician:  [facility] Ophthalmologist: Dr. Meg Spina  Pre-Procedure History & Physical: HPI:  Mallory Holland is a 53 y.o. female here for cataract surgery.   Past Medical History:  Diagnosis Date   Glaucoma    Hypertension     Past Surgical History:  Procedure Laterality Date   ABDOMINAL HYSTERECTOMY     broken ankle     COLONOSCOPY WITH PROPOFOL  N/A 02/21/2018   Procedure: COLONOSCOPY WITH PROPOFOL ;  Surgeon: Irby Mannan, MD;  Location: [facility] ENDOSCOPY;  Service: Endoscopy;  Laterality: N/A;    Prior to Admission medications   Medication Sig Start Date End Date Taking? Authorizing Provider  atorvastatin (LIPITOR) 20 MG tablet Take 20 mg by mouth daily.   Yes [provider]  cholecalciferol (VITAMIN D3) 25 MCG (1000 UNIT) tablet Take 1,000 Units by mouth daily.   Yes [provider]  brimonidine (ALPHAGAN) 0.2 % ophthalmic solution 3 (three) times daily.    [provider]  dorzolamide (TRUSOPT) 2 % ophthalmic solution 1 drop 3 (three) times daily.    [provider]  fluticasone Odis Bennetts) 50 MCG/ACT nasal spray  10/05/13   [provider]  ibuprofen (ADVIL) 800 MG tablet Take by mouth. Patient not taking: No sig reported 11/27/14   [provider]  lisinopril (ZESTRIL) 2.5 MG tablet  04/03/19   [provider]  naproxen  (NAPROSYN ) 500 MG tablet Take 1 po BID with food prn pain Patient not taking: No sig reported 03/19/17   Collette Dayhoff, MD    Allergies as of 09/26/2023   (No Known Allergies)    Family History  Problem Relation Age of Onset   Colon cancer Mother    Cancer Sister    Breast cancer Neg Hx     Social History   Socioeconomic History   Marital status: Widowed    Spouse name: Not on file   Number of children: Not on file   Years of education: Not on file   Highest education level: Not on file  Occupational History   Not on  file  Tobacco Use   Smoking status: Never   Smokeless tobacco: Never  Substance and Sexual Activity   Alcohol use: Yes    Comment: "more than several drinks a night"   Drug use: Never   Sexual activity: Not on file  Other Topics Concern   Not on file  Social History Narrative   Not on file   Social Drivers of Health   Financial Resource Strain: Not on file  Food Insecurity: Not on file  Transportation Needs: Not on file  Physical Activity: Not on file  Stress: Not on file  Social Connections: Not on file  Intimate Partner Violence: Not on file    Review of Systems: See HPI, otherwise negative ROS  Physical Exam: Ht 5\' 6"  (1.676 m)   Wt 69.9 kg   BMI 24.86 kg/m  General:   Alert, cooperative in NAD Head:  Normocephalic and atraumatic. Respiratory:  Normal work of breathing. Cardiovascular:  RRR  Impression/Plan: Mallory Holland is here for cataract surgery.  Risks, benefits, limitations, and alternatives regarding cataract surgery have been reviewed with the patient.  Questions have been answered.  All parties agreeable.   Trudi Fus, MD  10/19/2023, 7:13 AM

## 2023-10-21 ENCOUNTER — Emergency Department (HOSPITAL_COMMUNITY)
Admission: EM | Admit: 2023-10-21 | Discharge: 2023-10-21 | Disposition: A | Discharge status: Home / Self Care | Attending: Emergency Medicine | Admitting: Emergency Medicine

## 2023-10-21 ENCOUNTER — Encounter (HOSPITAL_COMMUNITY): Payer: Self-pay | Admitting: Emergency Medicine

## 2023-10-21 ENCOUNTER — Other Ambulatory Visit: Payer: Self-pay

## 2023-10-21 ENCOUNTER — Emergency Department (HOSPITAL_COMMUNITY)

## 2023-10-21 DIAGNOSIS — W109XXA Fall (on) (from) unspecified stairs and steps, initial encounter: Secondary | ICD-10-CM | POA: Diagnosis not present

## 2023-10-21 DIAGNOSIS — Y908 Blood alcohol level of 240 mg/100 ml or more: Secondary | ICD-10-CM | POA: Insufficient documentation

## 2023-10-21 DIAGNOSIS — Z79899 Other long term (current) drug therapy: Secondary | ICD-10-CM | POA: Insufficient documentation

## 2023-10-21 DIAGNOSIS — F1010 Alcohol abuse, uncomplicated: Secondary | ICD-10-CM | POA: Diagnosis not present

## 2023-10-21 DIAGNOSIS — S0990XA Unspecified injury of head, initial encounter: Secondary | ICD-10-CM | POA: Diagnosis present

## 2023-10-21 LAB — CBC WITH DIFFERENTIAL/PLATELET
Abs Immature Granulocytes: 0.02 10*3/uL (ref 0.00–0.07)
Basophils Absolute: 0 10*3/uL (ref 0.0–0.1)
Basophils Relative: 1 %
Eosinophils Absolute: 0.1 10*3/uL (ref 0.0–0.5)
Eosinophils Relative: 1 %
HCT: 30.1 % — ABNORMAL LOW (ref 36.0–46.0)
Hemoglobin: 10 g/dL — ABNORMAL LOW (ref 12.0–15.0)
Immature Granulocytes: 0 %
Lymphocytes Relative: 33 %
Lymphs Abs: 2.3 10*3/uL (ref 0.7–4.0)
MCH: 31.7 pg (ref 26.0–34.0)
MCHC: 33.2 g/dL (ref 30.0–36.0)
MCV: 95.6 fL (ref 80.0–100.0)
Monocytes Absolute: 0.6 10*3/uL (ref 0.1–1.0)
Monocytes Relative: 8 %
Neutro Abs: 4.1 10*3/uL (ref 1.7–7.7)
Neutrophils Relative %: 57 %
Platelets: 254 10*3/uL (ref 150–400)
RBC: 3.15 MIL/uL — ABNORMAL LOW (ref 3.87–5.11)
RDW: 14.8 % (ref 11.5–15.5)
WBC: 7.1 10*3/uL (ref 4.0–10.5)
nRBC: 0 % (ref 0.0–0.2)

## 2023-10-21 LAB — BASIC METABOLIC PANEL WITH GFR
Anion gap: 11 (ref 5–15)
BUN: 6 mg/dL (ref 6–20)
CO2: 24 mmol/L (ref 22–32)
Calcium: 9.6 mg/dL (ref 8.9–10.3)
Chloride: 109 mmol/L (ref 98–111)
Creatinine, Ser: 0.55 mg/dL (ref 0.44–1.00)
GFR, Estimated: >60 mL/min (ref >60)
Glucose, Bld: 111 mg/dL — ABNORMAL HIGH (ref 70–99)
Potassium: 3.1 mmol/L — ABNORMAL LOW (ref 3.5–5.1)
Sodium: 144 mmol/L (ref 135–145)

## 2023-10-21 LAB — ETHANOL: Alcohol, Ethyl (B): 304 mg/dL (ref <15)

## 2023-10-21 MED ORDER — KETOROLAC TROMETHAMINE 30 MG/ML IJ SOLN
30.0000 mg | Freq: Once | INTRAMUSCULAR | Status: AC
  Administered 2023-10-21: 30 mg via INTRAMUSCULAR
  Filled 2023-10-21: qty 1

## 2023-10-21 MED ORDER — IBUPROFEN 800 MG PO TABS: ORAL_TABLET | ORAL | 0 refills | Status: AC

## 2023-10-21 MED ORDER — IBUPROFEN 800 MG PO TABS: ORAL_TABLET | ORAL | 0 refills | Status: DC

## 2023-10-21 NOTE — ED Provider Notes (Signed)
 [place] EMERGENCY DEPARTMENT AT [facility] Provider Note   CSN: [id number] Arrival date & time: 10/21/23  5784     History  Chief Complaint  Patient presents with   Fall   Head Injury    Mallory Holland is a 53 y.o. female.  Patient has been drinking alcohol and fell and hit her head.  Questionable loss of consciousness.  Patient has a history of alcohol abuse  The history is provided by the patient and medical records. No language interpreter was used.  Fall This is a new problem. The current episode started less than 1 hour ago. The problem occurs rarely. The problem has not changed since onset.Associated symptoms include headaches. Pertinent negatives include no chest pain, no abdominal pain and no shortness of breath. Nothing aggravates the symptoms. Nothing relieves the symptoms. She has tried nothing for the symptoms.       Home Medications Prior to Admission medications   Medication Sig Start Date End Date Taking? Authorizing Provider  ibuprofen (ADVIL) 800 MG tablet Take 1 every 8 hours as needed for pain that is not relieved by Tylenol alone 10/21/23  Yes Marshawn Ninneman, MD  atorvastatin (LIPITOR) 20 MG tablet Take 20 mg by mouth daily.    [provider]  brimonidine (ALPHAGAN) 0.2 % ophthalmic solution 3 (three) times daily.    [provider]  cholecalciferol (VITAMIN D3) 25 MCG (1000 UNIT) tablet Take 1,000 Units by mouth daily.    [provider]  dorzolamide (TRUSOPT) 2 % ophthalmic solution 1 drop 3 (three) times daily.    [provider]  fluticasone Odis Bennetts) 50 MCG/ACT nasal spray  10/05/13   [provider]  lisinopril (ZESTRIL) 2.5 MG tablet  04/03/19   [provider]  naproxen  (NAPROSYN ) 500 MG tablet Take 1 po BID with food prn pain Patient not taking: No sig reported 03/19/17   Collette Dayhoff, MD      Allergies    Patient has no known allergies.    Review of Systems   Review of Systems   Constitutional:  Negative for chills and fever.  HENT:  Negative for ear pain and sore throat.   Eyes:  Negative for pain and visual disturbance.  Respiratory:  Negative for cough and shortness of breath.   Cardiovascular:  Negative for chest pain and palpitations.  Gastrointestinal:  Negative for abdominal pain and vomiting.  Genitourinary:  Negative for dysuria and hematuria.  Musculoskeletal:  Negative for arthralgias and back pain.  Skin:  Negative for color change and rash.  Neurological:  Positive for headaches. Negative for seizures and syncope.  All other systems reviewed and are negative.   Physical Exam Updated Vital Signs BP (!) 141/99 (BP Location: Right Arm)   Pulse 80   Temp 97.8 F (36.6 C) (Oral)   Resp (!) 21   Ht 5\' 6"  (1.676 m)   Wt 74.9 kg   SpO2 96%   BMI 26.65 kg/m  Physical Exam Vitals and nursing note reviewed.  Constitutional:      Appearance: She is well-developed.  HENT:     Head: Normocephalic.     Comments: Tender occipital head    Nose: Nose normal.  Eyes:     General: No scleral icterus.    Conjunctiva/sclera: Conjunctivae normal.  Neck:     Thyroid: No thyromegaly.  Cardiovascular:     Rate and Rhythm: Normal rate and regular rhythm.     Heart sounds: No murmur heard.  No friction rub. No gallop.  Pulmonary:     Breath sounds: No stridor. No wheezing or rales.  Chest:     Chest wall: No tenderness.  Abdominal:     General: There is no distension.     Tenderness: There is no abdominal tenderness. There is no rebound.  Musculoskeletal:        General: Normal range of motion.     Cervical back: Neck supple.  Lymphadenopathy:     Cervical: No cervical adenopathy.  Skin:    Findings: No erythema or rash.  Neurological:     Mental Status: She is alert and oriented to person, place, and time.     Motor: No abnormal muscle tone.     Coordination: Coordination normal.  Psychiatric:        Behavior: Behavior normal.     ED  Results / Procedures / Treatments   Labs (all labs ordered are listed, but only abnormal results are displayed) Labs Reviewed  BASIC METABOLIC PANEL WITH GFR - Abnormal; Notable for the following components:      Result Value   Potassium 3.1 (*)    Glucose, Bld 111 (*)    All other components within normal limits  CBC WITH DIFFERENTIAL/PLATELET - Abnormal; Notable for the following components:   RBC 3.15 (*)    Hemoglobin 10.0 (*)    HCT 30.1 (*)    All other components within normal limits  ETHANOL - Abnormal; Notable for the following components:   Alcohol, Ethyl (B) 304 (*)    All other components within normal limits    EKG None  Radiology CT Head Wo Contrast Result Date: 10/21/2023 CLINICAL DATA:  Trauma.  Status post fall. EXAM: CT HEAD WITHOUT CONTRAST CT CERVICAL SPINE WITHOUT CONTRAST TECHNIQUE: Multidetector CT imaging of the head and cervical spine was performed following the standard protocol without intravenous contrast. Multiplanar CT image reconstructions of the cervical spine were also generated. RADIATION DOSE REDUCTION: This exam was performed according to the departmental dose-optimization program which includes automated exposure control, adjustment of the mA and/or kV according to patient size and/or use of iterative reconstruction technique. COMPARISON:  09/15/2023 FINDINGS: CT HEAD FINDINGS Brain: No evidence of acute infarction, hemorrhage, hydrocephalus, extra-axial collection or mass lesion/mass effect. Vascular: No hyperdense vessel or unexpected calcification. Skull: Normal. Negative for fracture or focal lesion. Sinuses/Orbits: No acute abnormality. No air-fluid levels. Mild mucosal thickening involving the imaged portions of the inferior maxillary sinuses. Mastoid air cells are clear. Other: Posterior scalp hematoma identified measuring up to 1 cm in thickness, image 22/3. CT CERVICAL SPINE FINDINGS Alignment: Normal. Skull base and vertebrae: No acute fracture.  No primary bone lesion or focal pathologic process. Soft tissues and spinal canal: No prevertebral fluid or swelling. No visible canal hematoma. Disc levels: The disc spaces are well preserved. No significant degenerative change. Upper chest: No acute abnormality. Other: None IMPRESSION: 1. No acute intracranial abnormalities. 2. Posterior scalp hematoma. 3. No evidence for cervical spine fracture or subluxation. Electronically Signed   By: Kimberley Penman M.D.   On: 10/21/2023 08:44   CT Cervical Spine Wo Contrast Result Date: 10/21/2023 CLINICAL DATA:  Trauma.  Status post fall. EXAM: CT HEAD WITHOUT CONTRAST CT CERVICAL SPINE WITHOUT CONTRAST TECHNIQUE: Multidetector CT imaging of the head and cervical spine was performed following the standard protocol without intravenous contrast. Multiplanar CT image reconstructions of the cervical spine were also generated. RADIATION DOSE REDUCTION: This exam was performed according to the departmental dose-optimization  program which includes automated exposure control, adjustment of the mA and/or kV according to patient size and/or use of iterative reconstruction technique. COMPARISON:  09/15/2023 FINDINGS: CT HEAD FINDINGS Brain: No evidence of acute infarction, hemorrhage, hydrocephalus, extra-axial collection or mass lesion/mass effect. Vascular: No hyperdense vessel or unexpected calcification. Skull: Normal. Negative for fracture or focal lesion. Sinuses/Orbits: No acute abnormality. No air-fluid levels. Mild mucosal thickening involving the imaged portions of the inferior maxillary sinuses. Mastoid air cells are clear. Other: Posterior scalp hematoma identified measuring up to 1 cm in thickness, image 22/3. CT CERVICAL SPINE FINDINGS Alignment: Normal. Skull base and vertebrae: No acute fracture. No primary bone lesion or focal pathologic process. Soft tissues and spinal canal: No prevertebral fluid or swelling. No visible canal hematoma. Disc levels: The disc spaces  are well preserved. No significant degenerative change. Upper chest: No acute abnormality. Other: None IMPRESSION: 1. No acute intracranial abnormalities. 2. Posterior scalp hematoma. 3. No evidence for cervical spine fracture or subluxation. Electronically Signed   By: Kimberley Penman M.D.   On: 10/21/2023 08:44    Procedures Procedures    Medications Ordered in ED Medications  ketorolac  (TORADOL ) 30 MG/ML injection 30 mg (30 mg Intramuscular Given 10/21/23 5631)    ED Course/ Medical Decision Making/ A&P  CT scan of the head and cervical spine unremarkable                               Medical Decision Making Risk Prescription drug management.   Contusion to head with alcohol abuse.  Patient will take Motrin and follow-up with PCP        Final Clinical Impression(s) / ED Diagnoses Final diagnoses:  Injury of head, initial encounter  ETOH abuse    Rx / DC Orders ED Discharge Orders          Ordered    ibuprofen (ADVIL) 800 MG tablet        10/21/23 0920              Cheyenne Cotta, MD 10/21/23 1652

## 2023-10-21 NOTE — ED Notes (Signed)
 Patient has discharge orders, discharge teaching given by charge RN and this nurse with no further questions.

## 2023-10-21 NOTE — ED Triage Notes (Addendum)
 Pt fell down basement steps (approximately 11 stairs) hitting back of head on concrete floor. Not on blood thinners per EMS. EMS reports pt alert and oriented initially but increasingly become more lethargic. CBG was 91 per EMS. Pt currently in c-collar. Pt with c/o head and neck pain. Unknown if any LOC. Pt admits to having "two" beers tonight.

## 2023-10-21 NOTE — Discharge Instructions (Signed)
 Follow-up with your family doctor next week for recheck.

## 2023-11-09 ENCOUNTER — Encounter: Payer: Self-pay | Admitting: Ophthalmology

## 2023-11-16 ENCOUNTER — Encounter
Admission: RE | Disposition: A | Discharge status: Home / Self Care | Payer: Self-pay | Source: Home / Self Care | Attending: Ophthalmology

## 2023-11-16 ENCOUNTER — Ambulatory Visit: Payer: Self-pay | Admitting: Anesthesiology

## 2023-11-16 ENCOUNTER — Ambulatory Visit
Admission: RE | Admit: 2023-11-16 | Discharge: 2023-11-16 | Disposition: A | Discharge status: Home / Self Care | Attending: Ophthalmology | Admitting: Ophthalmology

## 2023-11-16 ENCOUNTER — Other Ambulatory Visit: Payer: Self-pay

## 2023-11-16 ENCOUNTER — Encounter: Payer: Self-pay | Admitting: Ophthalmology

## 2023-11-16 DIAGNOSIS — I10 Essential (primary) hypertension: Secondary | ICD-10-CM | POA: Insufficient documentation

## 2023-11-16 DIAGNOSIS — H2512 Age-related nuclear cataract, left eye: Secondary | ICD-10-CM | POA: Insufficient documentation

## 2023-11-16 SURGERY — PHACOEMULSIFICATION, CATARACT, WITH IOL INSERTION
Anesthesia: Monitor Anesthesia Care | Site: Eye | Laterality: Left

## 2023-11-16 MED ORDER — MIDAZOLAM HCL 2 MG/2ML IJ SOLN
INTRAMUSCULAR | Status: AC
  Filled 2023-11-16: qty 2

## 2023-11-16 MED ORDER — TETRACAINE HCL 0.5 % OP SOLN
OPHTHALMIC | Status: DC | PRN
  Administered 2023-11-16: 1 [drp] via OPHTHALMIC

## 2023-11-16 MED ORDER — ARMC OPHTHALMIC DILATING DROPS
OPHTHALMIC | Status: AC
Start: 2023-11-16 — End: ?
  Filled 2023-11-16: qty 0.5

## 2023-11-16 MED ORDER — MOXIFLOXACIN HCL 0.5 % OP SOLN
OPHTHALMIC | Status: DC | PRN
  Administered 2023-11-16: .2 mL via OPHTHALMIC

## 2023-11-16 MED ORDER — SIGHTPATH DOSE#1 NA HYALUR & NA CHOND-NA HYALUR IO KIT
PACK | INTRAOCULAR | Status: DC | PRN
  Administered 2023-11-16: 1 via OPHTHALMIC

## 2023-11-16 MED ORDER — DEXMEDETOMIDINE HCL IN NACL 80 MCG/20ML IV SOLN
INTRAVENOUS | Status: AC
  Filled 2023-11-16: qty 20

## 2023-11-16 MED ORDER — DEXMEDETOMIDINE HCL IN NACL 200 MCG/50ML IV SOLN
INTRAVENOUS | Status: DC | PRN
  Administered 2023-11-16 (×2): 8 ug via INTRAVENOUS
  Administered 2023-11-16: 4 ug via INTRAVENOUS

## 2023-11-16 MED ORDER — MIDAZOLAM HCL 2 MG/2ML IJ SOLN
INTRAMUSCULAR | Status: DC | PRN
  Administered 2023-11-16 (×2): 2 mg via INTRAVENOUS

## 2023-11-16 MED ORDER — PHENYLEPHRINE-KETOROLAC 1-0.3 % IO SOLN
INTRAOCULAR | Status: DC | PRN
  Administered 2023-11-16: 82 mL via OPHTHALMIC

## 2023-11-16 MED ORDER — FENTANYL CITRATE (PF) 100 MCG/2ML IJ SOLN
INTRAMUSCULAR | Status: DC | PRN
  Administered 2023-11-16 (×2): 50 ug via INTRAVENOUS

## 2023-11-16 MED ORDER — SODIUM CHLORIDE 0.9% FLUSH
3.0000 mL | Freq: Two times a day (BID) | INTRAVENOUS | Status: DC
Start: 2023-11-16 — End: 2023-11-16

## 2023-11-16 MED ORDER — ONDANSETRON HCL 4 MG/2ML IJ SOLN: 4.0000 mg | Freq: Once | INTRAMUSCULAR | Status: DC | PRN

## 2023-11-16 MED ORDER — NA HYALUR & NA CHOND-NA HYALUR 0.55-0.5 ML IO KIT
PACK | INTRAOCULAR | Status: DC | PRN
  Administered 2023-11-16: 1 via OPHTHALMIC

## 2023-11-16 MED ORDER — FENTANYL CITRATE (PF) 100 MCG/2ML IJ SOLN
INTRAMUSCULAR | Status: AC
Start: 2023-11-16 — End: ?
  Filled 2023-11-16: qty 2

## 2023-11-16 MED ORDER — LIDOCAINE HCL (PF) 2 % IJ SOLN
INTRAOCULAR | Status: DC | PRN
  Administered 2023-11-16: 1 mL via INTRAOCULAR

## 2023-11-16 MED ORDER — SODIUM CHLORIDE 0.9% FLUSH: 3.0000 mL | INTRAVENOUS | Status: DC | PRN

## 2023-11-16 MED ORDER — BRIMONIDINE TARTRATE-TIMOLOL 0.2-0.5 % OP SOLN
OPHTHALMIC | Status: DC | PRN
  Administered 2023-11-16: 1 [drp] via OPHTHALMIC

## 2023-11-16 MED ORDER — TETRACAINE HCL 0.5 % OP SOLN
OPHTHALMIC | Status: AC
Start: 2023-11-16 — End: ?
  Filled 2023-11-16: qty 4

## 2023-11-16 MED ORDER — SIGHTPATH DOSE#1 BSS IO SOLN
INTRAOCULAR | Status: DC | PRN
  Administered 2023-11-16: 15 mL

## 2023-11-16 MED ORDER — TETRACAINE HCL 0.5 % OP SOLN
1.0000 [drp] | OPHTHALMIC | Status: DC | PRN
  Administered 2023-11-16 (×3): 1 [drp] via OPHTHALMIC

## 2023-11-16 MED ORDER — ARMC OPHTHALMIC DILATING DROPS
1.0000 | OPHTHALMIC | Status: DC | PRN
  Administered 2023-11-16 (×3): 1 via OPHTHALMIC

## 2023-11-16 SURGICAL SUPPLY — 12 items
CATARACT SUITE SIGHTPATH (MISCELLANEOUS) ×1 IMPLANT
DISSECTOR HYDRO NUCLEUS 50X22 (MISCELLANEOUS) ×1 IMPLANT
DRSG TEGADERM 2-3/8X2-3/4 SM (GAUZE/BANDAGES/DRESSINGS) ×1 IMPLANT
FEE CATARACT SUITE SIGHTPATH (MISCELLANEOUS) ×1 IMPLANT
GLOVE BIOGEL PI IND STRL 8 (GLOVE) ×1 IMPLANT
GLOVE SURG LX STRL 7.5 STRW (GLOVE) ×1 IMPLANT
GLOVE SURG PROTEXIS BL SZ6.5 (GLOVE) ×1 IMPLANT
GLOVE SURG SYN 6.5 PF PI BL (GLOVE) ×1 IMPLANT
LENS IOL TECNIS MONO 23.0 (Intraocular Lens) IMPLANT
NDL FILTER BLUNT 18X1 1/2 (NEEDLE) ×1 IMPLANT
NEEDLE FILTER BLUNT 18X1 1/2 (NEEDLE) ×1 IMPLANT
SYR 3ML LL SCALE MARK (SYRINGE) ×1 IMPLANT

## 2023-11-16 NOTE — H&P (Signed)
 [facility]   Primary Care Physician:  [facility] Ophthalmologist: Dr. Meg Spina  Pre-Procedure History & Physical: HPI:  Anyiah Coverdale is a 54 y.o. female here for cataract surgery.   Past Medical History:  Diagnosis Date   Glaucoma    Hypertension     Past Surgical History:  Procedure Laterality Date   ABDOMINAL HYSTERECTOMY     broken ankle     COLONOSCOPY WITH PROPOFOL  N/A 02/21/2018   Procedure: COLONOSCOPY WITH PROPOFOL ;  Surgeon: Irby Mannan, MD;  Location: [facility] ENDOSCOPY;  Service: Endoscopy;  Laterality: N/A;    Prior to Admission medications   Medication Sig Start Date End Date Taking? Authorizing Provider  atorvastatin (LIPITOR) 20 MG tablet Take 20 mg by mouth daily.   Yes [provider]  cholecalciferol (VITAMIN D3) 25 MCG (1000 UNIT) tablet Take 1,000 Units by mouth daily.   Yes [provider]  fluticasone (FLONASE) 50 MCG/ACT nasal spray  10/05/13  Yes [provider]  brimonidine (ALPHAGAN) 0.2 % ophthalmic solution 3 (three) times daily.    [provider]  dorzolamide (TRUSOPT) 2 % ophthalmic solution 1 drop 3 (three) times daily.    [provider]  ibuprofen  (ADVIL ) 800 MG tablet Take 1 every 8 hours as needed for pain that is not relieved by Tylenol alone 10/21/23   Zammit, Joseph, MD  lisinopril (ZESTRIL) 2.5 MG tablet  04/03/19   [provider]  naproxen  (NAPROSYN ) 500 MG tablet Take 1 po BID with food prn pain Patient not taking: No sig reported 03/19/17   Collette Dayhoff, MD    Allergies as of 09/26/2023   (No Known Allergies)    Family History  Problem Relation Age of Onset   Colon cancer Mother    Cancer Sister    Breast cancer Neg Hx     Social History   Socioeconomic History   Marital status: Widowed    Spouse name: Not on file   Number of children: Not on file   Years of education: Not on file   Highest education level: Not on file   Occupational History   Not on file  Tobacco Use   Smoking status: Never   Smokeless tobacco: Never  Vaping Use   Vaping status: Never Used  Substance and Sexual Activity   Alcohol use: Yes    Comment: "more than several drinks a night"   Drug use: Never   Sexual activity: Not on file  Other Topics Concern   Not on file  Social History Narrative   Not on file   Social Drivers of Health   Financial Resource Strain: Not on file  Food Insecurity: Not on file  Transportation Needs: Not on file  Physical Activity: Not on file  Stress: Not on file  Social Connections: Not on file  Intimate Partner Violence: Not on file    Review of Systems: See HPI, otherwise negative ROS  Physical Exam: Ht 5\' 6"  (1.676 m)   Wt 69.9 kg   BMI 24.86 kg/m  General:   Alert, cooperative in NAD Head:  Normocephalic and atraumatic. Respiratory:  Normal work of breathing. Cardiovascular:  RRR  Impression/Plan: Madia Carvell is here for cataract surgery.  Risks, benefits, limitations, and alternatives regarding cataract surgery have been reviewed with the patient.  Questions have been answered.  All parties agreeable.   Trudi Fus, MD  11/16/2023, 7:10 AM

## 2023-11-16 NOTE — Transfer of Care (Signed)
 Immediate Anesthesia Transfer of Care Note  Patient: Mallory Holland  Procedure(s) Performed: PHACOEMULSIFICATION, CATARACT, WITH IOL INSERTION  5.14  00:41.0 (Left: Eye)  Patient Location: PACU  Anesthesia Type: MAC  Level of Consciousness: awake, alert  and patient cooperative  Airway and Oxygen Therapy: Patient Spontanous Breathing and Patient connected to supplemental oxygen  Post-op Assessment: Post-op Vital signs reviewed, Patient's Cardiovascular Status Stable, Respiratory Function Stable, Patent Airway and No signs of Nausea or vomiting  Post-op Vital Signs: Reviewed and stable  Complications: No notable events documented.

## 2023-11-16 NOTE — Anesthesia Postprocedure Evaluation (Signed)
 Anesthesia Post Note  Patient: Mallory Holland  Procedure(s) Performed: PHACOEMULSIFICATION, CATARACT, WITH IOL INSERTION  5.14  00:41.0 (Left: Eye)  Patient location during evaluation: PACU Anesthesia Type: MAC Level of consciousness: awake and alert Pain management: pain level controlled Vital Signs Assessment: post-procedure vital signs reviewed and stable Respiratory status: spontaneous breathing, nonlabored ventilation, respiratory function stable and patient connected to nasal cannula oxygen Cardiovascular status: stable and blood pressure returned to baseline Postop Assessment: no apparent nausea or vomiting Anesthetic complications: no   No notable events documented.   Last Vitals:  Vitals:   11/16/23 1314 11/16/23 1316  BP: (!) 162/89 (!) 162/89  Pulse: 71 76  Resp: (!) 22 12  Temp: 36.4 C 36.4 C  SpO2: 100% 100%    Last Pain:  Vitals:   11/16/23 1316  TempSrc:   PainSc: 0-No pain                 Kristell Wooding C Brettney Ficken

## 2023-11-16 NOTE — Op Note (Signed)
 OPERATIVE NOTE  Mallory Holland [id number] 11/16/2023   PREOPERATIVE DIAGNOSIS: Nuclear sclerotic cataract left eye. H25.12   POSTOPERATIVE DIAGNOSIS: Nuclear sclerotic cataract left eye. H25.12   PROCEDURE:  Phacoemusification with posterior chamber intraocular lens placement of the left eye  Ultrasound time: Procedure(s): PHACOEMULSIFICATION, CATARACT, WITH IOL INSERTION  5.14  00:41.0 (Left)  LENS:   Implant Name Type Inv. Item Serial No. Manufacturer Lot No. LRB No. Used Action  LENS IOL TECNIS MONO 23.0 - [id number] Intraocular Lens LENS IOL TECNIS MONO 23.0 [id number] [employer]  Left 1 Implanted      SURGEON:  Rosy Cooper. Donalda Fruit, MD   ANESTHESIA:  Topical with tetracaine drops, augmented with 1% preservative-free intracameral lidocaine .   COMPLICATIONS:  None.   DESCRIPTION OF PROCEDURE:  The patient was identified in the holding room and transported to the operating room and placed in the supine position under the operating microscope.  The left eye was identified as the operative eye, which was prepped and draped in the usual sterile ophthalmic fashion.   A 1 millimeter clear-corneal paracentesis was made inferotemporally. Preservative-free 1% lidocaine  mixed with 1:1,000 bisulfite-free aqueous solution of epinephrine  was injected into the anterior chamber. The anterior chamber was then filled with Viscoat viscoelastic. A 2.4 millimeter keratome was used to make a clear-corneal incision superotemporally. A curvilinear capsulorrhexis was made with a cystotome and capsulorrhexis forceps. Balanced salt solution was used to hydrodissect and hydrodelineate the nucleus. Phacoemulsification was then used to remove the lens nucleus and epinucleus. The remaining cortex was then removed using the irrigation and aspiration handpiece. Provisc was then placed into the capsular bag to distend it for lens placement. A +23.00 D DCB00 intraocular lens was then injected into the capsular bag. The  remaining viscoelastic was aspirated.   Wounds were hydrated with balanced salt solution.  The anterior chamber was inflated to a physiologic pressure with balanced salt solution.  No wound leaks were noted. Moxifloxacin was injected intracamerally.  Timolol and Brimonidine drops were applied to the eye.  The patient was taken to the recovery room in stable condition without complications of anesthesia or surgery.  [employer] 11/16/2023, 1:12 PM

## 2023-11-17 ENCOUNTER — Encounter: Payer: Self-pay | Admitting: Ophthalmology

## 2024-07-14 ENCOUNTER — Emergency Department (HOSPITAL_COMMUNITY)

## 2024-07-14 ENCOUNTER — Encounter (HOSPITAL_COMMUNITY): Payer: Self-pay

## 2024-07-14 ENCOUNTER — Emergency Department (HOSPITAL_COMMUNITY)
Admission: EM | Admit: 2024-07-14 | Discharge: 2024-07-14 | Disposition: A | Discharge status: Home / Self Care | Attending: Emergency Medicine | Admitting: Emergency Medicine

## 2024-07-14 ENCOUNTER — Other Ambulatory Visit: Payer: Self-pay

## 2024-07-14 DIAGNOSIS — S92301A Fracture of unspecified metatarsal bone(s), right foot, initial encounter for closed fracture: Secondary | ICD-10-CM

## 2024-07-14 DIAGNOSIS — S82891A Other fracture of right lower leg, initial encounter for closed fracture: Secondary | ICD-10-CM

## 2024-07-14 MED ORDER — HYDROCODONE-ACETAMINOPHEN 5-325 MG PO TABS
1.0000 | ORAL_TABLET | Freq: Once | ORAL | Status: AC
  Administered 2024-07-14: 1 via ORAL
  Filled 2024-07-14: qty 1

## 2024-07-14 MED ORDER — HYDROCODONE-ACETAMINOPHEN 5-325 MG PO TABS: 1.0000 | ORAL_TABLET | Freq: Four times a day (QID) | ORAL | 0 refills | Status: AC | PRN

## 2024-07-14 NOTE — ED Provider Notes (Signed)
 " [place] EMERGENCY DEPARTMENT AT [facility] Provider Note   CSN: [id number] Arrival date & time: 07/14/24  1148     Patient presents with: Ankle Pain   Mallory Holland is a 54 y.o. female.  He has history of hypertension and cataracts.  Presents to ER complaining of right ankle pain and swelling.  States she had a slip and fall on the ice 3 days ago, she was not been able to bear weight since then, she thought it might just be a sprain so was soaking the ankle but she has not had any improvement in symptoms so decided to come to the ER.  Denies numbness or tingling.  No knee pain or hip pain, she did not hit her head, she is not on blood thinners.   Ankle Pain      Prior to Admission medications  Medication Sig Start Date End Date Taking? Authorizing Provider  atorvastatin (LIPITOR) 20 MG tablet Take 20 mg by mouth daily.    [provider]  brimonidine  (ALPHAGAN ) 0.2 % ophthalmic solution 3 (three) times daily.    [provider]  cholecalciferol (VITAMIN D3) 25 MCG (1000 UNIT) tablet Take 1,000 Units by mouth daily.    [provider]  dorzolamide (TRUSOPT) 2 % ophthalmic solution 1 drop 3 (three) times daily.    [provider]  fluticasone OREN) 50 MCG/ACT nasal spray  10/05/13   [provider]  ibuprofen  (ADVIL ) 800 MG tablet Take 1 every 8 hours as needed for pain that is not relieved by Tylenol  alone 10/21/23   Zammit, Joseph, MD  lisinopril (ZESTRIL) 2.5 MG tablet  04/03/19   [provider]  naproxen  (NAPROSYN ) 500 MG tablet Take 1 po BID with food prn pain Patient not taking: No sig reported 03/19/17   Randol Albany, MD    Allergies: Patient has no known allergies.    Review of Systems  Updated Vital Signs BP (!) 159/79   Pulse 68   Temp 98.2 F (36.8 C) (Oral)   Resp 18   Ht 5' 6 (1.676 m)   Wt 79.4 kg   SpO2 99%   BMI 28.25 kg/m   Physical Exam Vitals and nursing note reviewed.   Constitutional:      General: She is not in acute distress.    Appearance: She is well-developed.  HENT:     Head: Normocephalic and atraumatic.     Mouth/Throat:     Mouth: Mucous membranes are moist.  Eyes:     Conjunctiva/sclera: Conjunctivae normal.  Cardiovascular:     Rate and Rhythm: Normal rate and regular rhythm.     Heart sounds: No murmur heard. Pulmonary:     Effort: Pulmonary effort is normal. No respiratory distress.     Breath sounds: Normal breath sounds.  Abdominal:     Palpations: Abdomen is soft.     Tenderness: There is no abdominal tenderness.  Musculoskeletal:        General: No swelling.     Cervical back: Neck supple.     Comments: No skin breakdown to the right ankle, there is lateral malleolus swelling.  DP and PT pulses intact in the right foot.  Capillary refill is brisk in the toes.  There is tenderness over the dorsum of the foot proximally.  No tenderness to the right knee or hip.  No tenderness or defect to the Achilles.  Skin:    General: Skin is warm and dry.  Capillary Refill: Capillary refill takes less than 2 seconds.  Neurological:     General: No focal deficit present.     Mental Status: She is alert and oriented to person, place, and time.  Psychiatric:        Mood and Affect: Mood normal.     (all labs ordered are listed, but only abnormal results are displayed) Labs Reviewed - No data to display  EKG: None  Radiology: DG Ankle Complete Right Result Date: 07/14/2024 CLINICAL DATA:  [id number] Fall [id number] EXAM: RIGHT ANKLE - COMPLETE 3+ VIEW COMPARISON:  September 30, 2009 FINDINGS: There is a oblique fracture of the distal fibula. There is mild lateral displacement of the distal fragment. Medial clear space measures the upper limits of normal at 5 mm. There is ill-defined calcifications along the lateral margin of the distal fibula. The distal fibula self appears mildly mottled in appearance. Soft tissue edema. IMPRESSION: 1. Mildly  displaced oblique fracture of the distal fibula. 2. The distal fibula itself appears mildly mottled in appearance with adjacent hazy calcifications. This could be due history of prior fracture at this site in 2011, but underlying osseous lesion is not excluded. Recommend correlation with history of malignancy. Electronically Signed   By: Corean Salter M.D.   On: 07/14/2024 12:22     Procedures   Medications Ordered in the ED - No data to display                                  Medical Decision Making Diagnosis includes but limited to fracture, sprain, strain, dislocation, other ED course: Patient had a mechanical fall after slipping on ice several days ago and bending pain and swelling since then, she is not able to bear weight.  X-rays were ordered, I viewed and interpreted these independently.  X-ray of the right ankle shows a mildly displaced oblique fracture of the distal fibula, shows mildly well appearance with adjacent hazy calcifications possibly due to prior fracture but cannot rule out underlying osseous lesion.  Patient does not have cancer history. Stray of her right foot shows nondisplaced fracture of 2nd, 3rd and 4th proximal metatarsals.  Consulted orthopedics and spoke with Dr. Conway.  Cussed patient's imaging finding, he recommended seeing patient in cam walker, nonweightbearing due to the metatarsal fractures and he will see her earlier this week.  He recommended CT prior to being discharged but do not need to wait for CT.  Patient was given Norco for pain, she had improvement in symptoms, she agreeable with plan of care and discharge.  She was given strict return precautions.  She had no skin breakdown, she had good pulses.  Amount and/or Complexity of Data Reviewed Radiology: ordered.  Risk Prescription drug management.        Final diagnoses:  None    ED Discharge Orders     None          Suellen Sherran LABOR, [place] 07/14/24 1901  "

## 2024-07-14 NOTE — ED Triage Notes (Signed)
 Pt bib r[facility]ms with complaints of right ankle pain and swelling. Pt states that she fell on the ice on January 29 and it hasn't got any better. Pt states her pain is a 9/10. Pt did not hit her head and is not on blood thinners.

## 2024-07-14 NOTE — Discharge Instructions (Addendum)
 Were evaluated in the ER today for ankle and foot pain after a fall several days ago.  You have a fracture of your ankle as well as the 3 bones in your foot.  I spoke with Dr. Genelle is on-call for orthopedics.  He wants us  to treat you with a cam walker boot, use crutches and do not bear weight on your foot and follow-up with him in the office.    Please call the office tomorrow morning for close outpatient follow-up.  You were prescribed opiate pain medication. This can have many sided effect such as drowsiness, slowed breathing, and conspitation. Do not take it with other medications that cause drowsiness, or drink alcohol while taking it. Take a stool softener over the counter while taking it.

## 2024-07-14 NOTE — ED Notes (Signed)
 Patient transported to CT

## 2024-08-02 ENCOUNTER — Ambulatory Visit: Admitting: Orthopedic Surgery
